# Patient Record
Sex: Female | Born: 1995 | Race: White | Hispanic: No | Marital: Single | Smoking: Former smoker
Health system: Southern US, Community
[De-identification: ages and names within clinical notes are randomized; demographics above are authoritative.]

## PROBLEM LIST (undated history)

## (undated) DIAGNOSIS — K8020 Calculus of gallbladder without cholecystitis without obstruction: Secondary | ICD-10-CM

## (undated) DIAGNOSIS — Z3490 Encounter for supervision of normal pregnancy, unspecified, unspecified trimester: Secondary | ICD-10-CM

## (undated) DIAGNOSIS — O99210 Obesity complicating pregnancy, unspecified trimester: Secondary | ICD-10-CM

## (undated) HISTORY — PX: NO PAST SURGERIES: SHX2092

## (undated) HISTORY — DX: Obesity complicating pregnancy, unspecified trimester: O99.210

## (undated) HISTORY — DX: Calculus of gallbladder without cholecystitis without obstruction: K80.20

## (undated) HISTORY — DX: Encounter for supervision of normal pregnancy, unspecified, unspecified trimester: Z34.90

---
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
## 2014-03-23 ENCOUNTER — Observation Stay: Payer: Self-pay | Admitting: Obstetrics and Gynecology

## 2014-03-24 ENCOUNTER — Observation Stay: Payer: Self-pay | Admitting: Obstetrics and Gynecology

## 2014-03-29 ENCOUNTER — Ambulatory Visit: Payer: Self-pay

## 2014-03-29 LAB — BASIC METABOLIC PANEL
ANION GAP: 8 (ref 7–16)
BUN: 9 mg/dL
CHLORIDE: 105 mmol/L
Calcium, Total: 9 mg/dL
Co2: 23 mmol/L
Creatinine: 0.52 mg/dL
Glucose: 75 mg/dL
Potassium: 4.3 mmol/L
Sodium: 136 mmol/L

## 2014-03-29 LAB — URIC ACID: Uric Acid: 5.6 mg/dL

## 2014-03-29 LAB — WBC: WBC: 13.1 10*3/uL — ABNORMAL HIGH (ref 3.6–11.0)

## 2014-03-29 LAB — PLATELET COUNT: Platelet: 257 10*3/uL (ref 150–440)

## 2014-03-29 LAB — SGOT (AST)(ARMC): SGOT(AST): 17 U/L

## 2014-03-29 LAB — HEMATOCRIT: HCT: 33.8 % — ABNORMAL LOW (ref 35.0–47.0)

## 2014-03-29 LAB — RBC: RBC: 3.92 10*6/uL (ref 3.80–5.20)

## 2014-03-29 LAB — HEMOGLOBIN: HGB: 10.9 g/dL — ABNORMAL LOW (ref 12.0–16.0)

## 2014-04-02 ENCOUNTER — Other Ambulatory Visit: Payer: Self-pay | Admitting: Obstetrics and Gynecology

## 2014-04-02 LAB — PROTEIN / CREATININE RATIO, URINE
Creatinine, Urine Random: 97 mg/dL (ref 30–125)
Protein, Urine: 16 mg/dL (ref 0–9)
Protein/Creat. Ratio: 165 mg/gCREAT (ref 0–200)

## 2014-04-08 ENCOUNTER — Inpatient Hospital Stay
Admit: 2014-04-08 | Disposition: A | Discharge status: Home / Self Care | Payer: Self-pay | Attending: Obstetrics and Gynecology | Admitting: Obstetrics and Gynecology

## 2014-04-08 LAB — CBC WITH DIFFERENTIAL/PLATELET
BASOS ABS: 0 10*3/uL (ref 0.0–0.1)
Basophil %: 0.3 %
EOS ABS: 0 10*3/uL (ref 0.0–0.7)
Eosinophil %: 0.2 %
HCT: 32.3 % — AB (ref 35.0–47.0)
HGB: 11 g/dL — ABNORMAL LOW (ref 12.0–16.0)
LYMPHS PCT: 13.8 %
Lymphocyte #: 2.1 10*3/uL (ref 1.0–3.6)
MCH: 29.1 pg (ref 26.0–34.0)
MCHC: 33.9 g/dL (ref 32.0–36.0)
MCV: 86 fL (ref 80–100)
Monocyte #: 1 x10 3/mm — ABNORMAL HIGH (ref 0.2–0.9)
Monocyte %: 6.9 %
NEUTROS ABS: 11.8 10*3/uL — AB (ref 1.4–6.5)
NEUTROS PCT: 78.8 %
Platelet: 253 10*3/uL (ref 150–440)
RBC: 3.77 10*6/uL — ABNORMAL LOW (ref 3.80–5.20)
RDW: 13.5 % (ref 11.5–14.5)
WBC: 15 10*3/uL — ABNORMAL HIGH (ref 3.6–11.0)

## 2014-04-08 LAB — GC/CHLAMYDIA PROBE AMP

## 2014-04-09 LAB — PROTEIN / CREATININE RATIO, URINE
CREATININE, URINE RANDOM: 43 mg/dL (ref 30–125)
PROTEIN/CREAT. RATIO: 209 mg/g{creat} — AB (ref 0–200)
Protein, Urine: 9 mg/dL (ref 0–9)

## 2014-04-11 LAB — HEMATOCRIT: HCT: 27.8 % — ABNORMAL LOW (ref 35.0–47.0)

## 2014-04-28 NOTE — Consult Note (Signed)
Referral Information:  Reason for Referral 19 yo gravida 2 para 0010 at 6538w0d by stated LMP of 07/11/2013 is referred due to obesity, cholelithiasis and FOB with type 1 DM.   Referring Physician Acquanetta BellingAngela Lugiano CNM, [facility]   Prenatal Hx The patient was [facility] 10/07/2013-10/08/2013 for gallstones.  There were several noted on US with the largest measuring 2 cm.  LFTs normal.  Episode of hives at 13 weeks treated with prednisone dose pack.   Past Obstetrical Hx 01[place]06[place]  Had had 2 ultrasounds and heartbeat had been seen.  Was told she couldn't carry a pregnancy.   Home Medications: Medication Instructions Status  Prenatal Multivitamins Prenatal Multivitamins oral tablet 1 tab(s) orally once a day Active   Allergies:   No Known Allergies:   Vital Signs/Notes:  Nursing Vital Signs: **Vital Signs.:   19-Oct-15 10:45  Vital Signs Type Routine  Temperature Temperature (F) 98.3  Celsius 36.8  Temperature Source oral  Pulse Pulse 96  Respirations Respirations 18  Systolic BP Systolic BP 115  Diastolic BP (mmHg) Diastolic BP (mmHg) 63  Mean BP 80  Pulse Ox % Pulse Ox % 99  Pulse Ox Activity Level  At rest  Oxygen Delivery Room Air/ 21 %   Perinatal Consult:  LMP 11-Jul-2013   PGyn Hx Thinks she conceived on DMPA   Past Medical History cont'd Cholelithiasis   PSurg Hx None   FHx FOB has type 1 DM diagnosed at age 89.  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 obesity (BMI = 36) 2. cholelithiasis - currently asymptomatic  3. FOB with type 1 DM 4. History of early second trimester loss?   Recommendations 1. obesity (BMI = 36) -- HbA1C today -- early and routine glucose screening -- ultrasounds for growth at 28 and 36 weeks -- limit PN weight gain to 15 lbs -- ASA 81 mg po daily starting now 2. cholelithiasis without cholecystitis -- the patient has been counseled by Ms. Hilda BladesLugiano about a low fat diet -- the patient would be managed expectantly during pregnancy, although should she need surgery, the ideal time would be prior to [redacted] weeks gestation.   3. FOB with type 1 DM -- the patient was counseled that type 1 DM is now inheritable 4. History of early second trimester loss? -- documentation is not available and paitent was 19 years old   Plan:  Ultrasound at what gestational ages 6328 and 6334 weeks   Additional Testing HbA1C, Early and routine glucose screening   Comment/Plan Thank you for allowing us to participate in her care.    Total Time Spent with Patient 45 minutes   Office Use  Only 99243  Level 3 ( ) NEW office consult detailed   Coding Description: MATERNAL CONDITIONS/HISTORY INDICATION(S).   Obesity - BMI greater than equal to 30.   OTHER: cholelithiasis.  Electronic Signatures: Lady Deutscher (MD)  (Signed 19-Oct-15 14:10)  Authored: Referral, Home Medications, Allergies, Vital Signs/Notes, Consult, Exam, Impression, Plan, Billing, Coding Description   Last Updated: 19-Oct-15 14:10 by Lady Deutscher (MD)

## 2014-04-30 LAB — SURGICAL PATHOLOGY

## 2014-05-06 NOTE — Op Note (Signed)
PATIENT NAME:  Alicia Weaver, [name]#:  [id number] DATE OF BIRTH:  April 14, 1995  DATE OF PROCEDURE:  04/10/2014  PREOPERATIVE DIAGNOSES:  1. A 19 year old gravida 2, para 0-0-1-0 at 7839 and 5 weeks.  2. Gestational hypertension, induction of labor.  3. Group B streptococcus negative.  4. Maternal exhaustion  POSTOPERATIVE DIAGNOSES: 1. A 19 year old gravida 2, para 0-0-1-0 at 5739 and 5 weeks.  2. Gestational hypertension, induction of labor.  3. Group B streptococcus negative.  4. Delivery of a viable female infant.   5. Thick meconium.  6. Nuchal cord x3.  7. Prolonged second stage. 8. Marginal cord insertion  PROCEDURE: Vacuum assisted vaginal delivery with repair of right sulcal laceration.   SURGEON: Christeen DouglasBethany Kadra Kohan, MD   ASSISTANT: Milon Scorearon Jones, CNM   ESTIMATED BLOOD LOSS: 400 mL.   ANESTHESIA: Epidural and local anesthesia.   COMPLICATIONS: None.   FINDINGS: Viable female infant weighing 6 pounds, 10 ounces with Apgars of 2 and 8 at 1 and 5 minutes respectively, named Alicia Weaver. Thick terminal meconium. Nuchal cord tightly wrapped x3. Group B streptococcus negative, Rh+.   INDICATION FOR PROCEDURE: Ms. [name] is a 19 year old gravida 2, para 0-0-1-0, who presented for induction of labor for gestational hypertension. Her blood pressures did range from normal to the mild range and her preeclampsia laboratories were negative.   PROCEDURE: The patient underwent cervical ripening with Cervidil and Cytotec. She was augmented with a little bit of Pitocin during her laboring course. She did reach the second stage of labor, at which point she began to push over an intact perineum. Maternal pushing efforts were weak and the patient did complain of exhaustion. She pushed actively and impressively for 4.5 hours, at which point the decision was made to proceed with a vacuum-assisted vaginal delivery for maternal exhaustion. The risks and benefits of this procedure were discussed with the  patient and she agreed to the procedure.   After adequate anesthesia was assured, the vacuum was placed. The Kiwi vacuum cup was placed at the fetal vertex and vaginal tissue was assured to be away from the cup using digital evaluation.  At the next contraction with good maternal effort, the fetal head was delivered with 1 pull and no pop off. There were 3 tight nuchal cords: The cords were doubly clamped and cut and a second double clamping and cutting was required to release the fetal head. The anterior shoulder delivered promptly and the baby's body delivered immediately. He was not vigorous and he was passed immediately to awaiting Neonatal Intensive Care Unit staff. Terminal meconium was noted at this time, as well. The placenta delivered with gentle traction on the cord and was intact. A marginal cord insertion was noted and meconium-stained membranes were noted.   The fundus was massaged and found to be firm. Postpartum Pitocin was given. Evaluation of the vagina and perineum revealed a deep right sulcal laceration. Rectal examination revealed intact rectal mucosa. This was repaired with 2-0 and 3-0 Vicryl Rapide. The vagina was packed with a single sponge for 1 hour post the procedure to control oozing from the suture sites. The vagina was edematous, but no other lacerations were noted. Bleeding was moderate. The patient tolerated these procedures well and is stable in recovery.    ____________________________ Cline CoolsBethany E. Karrington Studnicka, MD beb:AT D: 04/10/2014 12:52:51 ET T: 04/10/2014 13:26:15 ET JOB#: [id number]  cc: Cline CoolsBethany E. Asberry Lascola, MD, <Dictator> Cline CoolsBETHANY E Breeze Berringer MD ELECTRONICALLY SIGNED 04/10/2014 17:45

## 2014-05-15 NOTE — H&P (Signed)
L&D Evaluation:  History:  HPI 18yo G2P0010 at 39+3days presenting for iol/gHTN. Occasional contracations, good FM, no LOF or VB. Denies Headache, RUQ pain, visual changes. +edema. BP max on admission 158/93.  GBS neg, Rh postive, rubella immune. PreE labs pending. Urine dip pending. WBC elevated to 15.0.   Preg c/b: cholelithiasis, obesity, gHTN on 81mg  ASA per MFM recommendations which she stopped at 36wks.  Postpartum: Pt plans breastfeeding, Nexplanon   Patient's Surgical History none   Medications Pre Natal Vitamins   Allergies NKDA   Social History none   Family History FOB Type 1 diabetic   ROS:  ROS All systems were reviewed.  HEENT, CNS, GI, GU, Respiratory, CV, Renal and Musculoskeletal systems were found to be normal., no RUQ pain   Exam:  Vital Signs BP >140/90   Urine Protein not completed   General no apparent distress   Mental Status clear   Chest clear   Heart normal sinus rhythm   Abdomen gravid, non-tender   Estimated Fetal Weight Average for gestational age   Fetal Position Cephalic   Back no CVAT   Edema 1+   Reflexes 1+   Clonus negative   Mebranes Intact   FHT normal rate with no decels, +accels   Fetal Heart Rate 145   Ucx irregular   Skin dry   Lymph no lymphadenopathy   Impression:  Impression PIH   Plan:  Plan UA, PIH panel   Comments IOL for gHTN. 20mg  iv labetolol within 15 min if severe range BP. PIH panel and urine dip pending. No abx for GBS neg. Baby boy "Gunner".   Electronic Signatures: Cline CoolsBeasley, Jerol Rufener E (MD)  (Signed 03-Apr-16 22:13)  Authored: L&D Evaluation   Last Updated: 03-Apr-16 22:13 by Cline CoolsBeasley, Kennady Zimmerle E (MD)

## 2014-05-15 NOTE — H&P (Signed)
L&D Evaluation:  History:  HPI 4319 G1 at 37+1 by LMP c/w 20 with new onset HTN yesterday in the office. Yesterday 140/80 then 144/88. Evaluation today concern for BP 160/90. Sent directly to L&D for BP monitoring. No HA, vision changes, RUQ pain. No ctx, LOF, VB.   Patient's Medical History No Chronic Illness   Patient's Surgical History none   Medications Pre Natal Vitamins   Allergies NKDA   Social History none   Family History Non-Contributory   ROS:  ROS All systems were reviewed.  HEENT, CNS, GI, GU, Respiratory, CV, Renal and Musculoskeletal systems were found to be normal.   Exam:  Vital Signs stable  BP 105-126/63-78   Urine Protein 1+   General no apparent distress   Mental Status clear   Chest clear   Heart normal sinus rhythm   Abdomen gravid, non-tender   Estimated Fetal Weight Average for gestational age   Edema 1+   Reflexes 1+   Clonus negative   Plan:  Comments 1. R/O GHTN: Ms. Dareen Pianonderson displays no evidence of HTN currently and is asymptomatic with normal labs. I have concerns that the check today was erroneous. Her arm circumference is right at the cut off for being appropriate for that cuff in our office and I question the legitimacy of it on her. Will monitor BP tomorrow AM for certainty. Strict precautions provided. Discussed the r/b/a of IOL 2. UTI noted on UA yesterday will treat with Macrobid.  3. D/C home stable, follow-up in AM.   Electronic Signatures: Areta HaberGoodman, David M (MD)  (Signed 18-Mar-16 14:02)  Authored: L&D Evaluation   Last Updated: 18-Mar-16 14:02 by Areta HaberGoodman, David M (MD)

## 2014-05-15 NOTE — H&P (Signed)
L&D Evaluation:  History:  HPI 3219 G1 at 37+2 by LMP c/w 20 with new onset HTN yesterday in the office. Thursday 140/80 then 144/88. Evaluation Friday concern for BP 160/90, but BP in triage for 6 hours was low BP 105-126/63-78s. Returns today for follow-up monitoring. No HA, vision changes, RUQ pain. No ctx, LOF, VB.   Patient's Medical History No Chronic Illness   Patient's Surgical History none   Medications Pre Natal Vitamins   Allergies NKDA   Social History none   Family History Non-Contributory   ROS:  ROS All systems were reviewed.  HEENT, CNS, GI, GU, Respiratory, CV, Renal and Musculoskeletal systems were found to be normal.   Exam:  Vital Signs stable  120-137/56-83   Urine Protein 1+   General no apparent distress   Mental Status clear   Chest clear   Heart normal sinus rhythm   Abdomen gravid, non-tender   Estimated Fetal Weight Average for gestational age   Edema 1+   Reflexes 1+   Clonus negative   FHT normal rate with no decels, Reactive   Plan:  Comments 1. R/O GHTN: Ms. Dareen Pianonderson displays no evidence of HTN currently and is asymptomatic with normal labs. I have concerns that the check in office was erroneous. Her arm circumference is right at the cut off for being appropriate for that cuff in our office and I question the legitimacy of it on her.  Strict precautions provided. Discussed the r/b/a of IOL Plan for follow-up Wednesday. 2. UTI noted on UA yesterday will treat with Macrobid.  3. D/C home stable, Wednesday in office   Follow Up Appointment need to schedule   Electronic Signatures: Areta HaberGoodman, David M (MD)  (Signed 19-Mar-16 11:07)  Authored: L&D Evaluation   Last Updated: 19-Mar-16 11:07 by Areta HaberGoodman, David M (MD)

## 2014-11-06 ENCOUNTER — Emergency Department
Admission: EM | Admit: 2014-11-06 | Discharge: 2014-11-06 | Disposition: A | Discharge status: Home / Self Care | Payer: Self-pay | Attending: Emergency Medicine | Admitting: Emergency Medicine

## 2014-11-06 ENCOUNTER — Emergency Department: Payer: Self-pay

## 2014-11-06 ENCOUNTER — Encounter: Payer: Self-pay | Admitting: Emergency Medicine

## 2014-11-06 DIAGNOSIS — S93401A Sprain of unspecified ligament of right ankle, initial encounter: Secondary | ICD-10-CM | POA: Insufficient documentation

## 2014-11-06 DIAGNOSIS — Y9389 Activity, other specified: Secondary | ICD-10-CM | POA: Insufficient documentation

## 2014-11-06 DIAGNOSIS — Y998 Other external cause status: Secondary | ICD-10-CM | POA: Insufficient documentation

## 2014-11-06 DIAGNOSIS — Y92410 Unspecified street and highway as the place of occurrence of the external cause: Secondary | ICD-10-CM | POA: Insufficient documentation

## 2014-11-06 DIAGNOSIS — Z87891 Personal history of nicotine dependence: Secondary | ICD-10-CM | POA: Insufficient documentation

## 2014-11-06 MED ORDER — IBUPROFEN 800 MG PO TABS: 800.0000 mg | ORAL_TABLET | Freq: Three times a day (TID) | ORAL | Status: DC | PRN

## 2014-11-06 MED ORDER — IBUPROFEN 800 MG PO TABS
800.0000 mg | ORAL_TABLET | Freq: Once | ORAL | Status: AC
  Administered 2014-11-06: 800 mg via ORAL

## 2014-11-06 MED ORDER — IBUPROFEN 800 MG PO TABS
ORAL_TABLET | ORAL | Status: AC
  Administered 2014-11-06: 800 mg via ORAL
  Filled 2014-11-06: qty 1

## 2014-11-06 NOTE — Discharge Instructions (Signed)
Ankle Sprain  An ankle sprain is an injury to the strong, fibrous tissues (ligaments) that hold the bones of your ankle joint together.   CAUSES  An ankle sprain is usually caused by a fall or by twisting your ankle. Ankle sprains most commonly occur when you step on the outer edge of your foot, and your ankle turns inward. People who participate in sports are more prone to these types of injuries.   SYMPTOMS    Pain in your ankle. The pain may be present at rest or only when you are trying to stand or walk.   Swelling.   Bruising. Bruising may develop immediately or within 1 to 2 days after your injury.   Difficulty standing or walking, particularly when turning corners or changing directions.  DIAGNOSIS   Your caregiver will ask you details about your injury and perform a physical exam of your ankle to determine if you have an ankle sprain. During the physical exam, your caregiver will press on and apply pressure to specific areas of your foot and ankle. Your caregiver will try to move your ankle in certain ways. An X-ray exam may be done to be sure a bone was not broken or a ligament did not separate from one of the bones in your ankle (avulsion fracture).   TREATMENT   Certain types of braces can help stabilize your ankle. Your caregiver can make a recommendation for this. Your caregiver may recommend the use of medicine for pain. If your sprain is severe, your caregiver may refer you to a surgeon who helps to restore function to parts of your skeletal system (orthopedist) or a physical therapist.  HOME CARE INSTRUCTIONS    Apply ice to your injury for 1-2 days or as directed by your caregiver. Applying ice helps to reduce inflammation and pain.    Put ice in a plastic bag.    Place a towel between your skin and the bag.    Leave the ice on for 15-20 minutes at a time, every 2 hours while you are awake.   Only take over-the-counter or prescription medicines for pain, discomfort, or fever as directed by  your caregiver.   Elevate your injured ankle above the level of your heart as much as possible for 2-3 days.   If your caregiver recommends crutches, use them as instructed. Gradually put weight on the affected ankle. Continue to use crutches or a cane until you can walk without feeling pain in your ankle.   If you have a plaster splint, wear the splint as directed by your caregiver. Do not rest it on anything harder than a pillow for the first 24 hours. Do not put weight on it. Do not get it wet. You may take it off to take a shower or bath.   You may have been given an elastic bandage to wear around your ankle to provide support. If the elastic bandage is too tight (you have numbness or tingling in your foot or your foot becomes cold and blue), adjust the bandage to make it comfortable.   If you have an air splint, you may blow more air into it or let air out to make it more comfortable. You may take your splint off at night and before taking a shower or bath. Wiggle your toes in the splint several times per day to decrease swelling.  SEEK MEDICAL CARE IF:    You have rapidly increasing bruising or swelling.   Your toes feel   extremely cold or you lose feeling in your foot.   Your pain is not relieved with medicine.  SEEK IMMEDIATE MEDICAL CARE IF:   Your toes are numb or blue.   You have severe pain that is increasing.  MAKE SURE YOU:    Understand these instructions.   Will watch your condition.   Will get help right away if you are not doing well or get worse.     This information is not intended to replace advice given to you by your health care provider. Make sure you discuss any questions you have with your health care provider.     Document Released: 12/22/2004 Document Revised: 01/12/2014 Document Reviewed: 01/03/2011  Elsevier Interactive Patient Education 2016 [employer]
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
## 2015-02-23 ENCOUNTER — Ambulatory Visit
Admission: EM | Admit: 2015-02-23 | Discharge: 2015-02-23 | Disposition: A | Discharge status: Home / Self Care | Payer: BLUE CROSS/BLUE SHIELD | Attending: Family Medicine | Admitting: Family Medicine

## 2015-02-23 ENCOUNTER — Encounter: Payer: Self-pay | Admitting: Gynecology

## 2015-02-23 DIAGNOSIS — J029 Acute pharyngitis, unspecified: Secondary | ICD-10-CM

## 2015-02-23 LAB — CBC WITH DIFFERENTIAL/PLATELET
BASOS ABS: 0.1 10*3/uL (ref 0–0.1)
Basophils Relative: 1 %
Eosinophils Absolute: 0 10*3/uL (ref 0–0.7)
Eosinophils Relative: 0 %
HCT: 36.8 % (ref 35.0–47.0)
Hemoglobin: 12.3 g/dL (ref 12.0–16.0)
LYMPHS PCT: 12 %
Lymphs Abs: 1.8 10*3/uL (ref 1.0–3.6)
MCH: 28.1 pg (ref 26.0–34.0)
MCHC: 33.4 g/dL (ref 32.0–36.0)
MCV: 84.1 fL (ref 80.0–100.0)
MONO ABS: 1.1 10*3/uL — AB (ref 0.2–0.9)
MONOS PCT: 8 %
NEUTROS ABS: 11.2 10*3/uL — AB (ref 1.4–6.5)
Neutrophils Relative %: 79 %
Platelets: 299 10*3/uL (ref 150–440)
RBC: 4.38 MIL/uL (ref 3.80–5.20)
RDW: 13.4 % (ref 11.5–14.5)
WBC: 14.2 10*3/uL — ABNORMAL HIGH (ref 3.6–11.0)

## 2015-02-23 LAB — RAPID STREP SCREEN (MED CTR MEBANE ONLY): Streptococcus, Group A Screen (Direct): NEGATIVE

## 2015-02-23 LAB — MONONUCLEOSIS SCREEN: Mono Screen: NEGATIVE

## 2015-02-23 LAB — RAPID INFLUENZA A&B ANTIGENS
Influenza A (ARMC): NOT DETECTED
Influenza B (ARMC): NOT DETECTED

## 2015-02-23 MED ORDER — AZITHROMYCIN 250 MG PO TABS: ORAL_TABLET | ORAL | Status: DC

## 2015-02-23 MED ORDER — AZITHROMYCIN 500 MG PO TABS
500.0000 mg | ORAL_TABLET | Freq: Once | ORAL | Status: AC
  Administered 2015-02-23: 500 mg via ORAL

## 2015-02-23 MED ORDER — FEXOFENADINE-PSEUDOEPHED ER 180-240 MG PO TB24: 1.0000 | ORAL_TABLET | Freq: Every day | ORAL | Status: DC

## 2015-02-23 NOTE — ED Provider Notes (Signed)
CSN: [id number]     Arrival date & time 02/23/15  1547 History   First MD Initiated Contact with Patient 02/23/15 1840    Nurses notes were reviewed.  Chief Complaint  Patient presents with  . Sore Throat  . Otalgia   Patient states she's had a sore throat since Wednesday. He reports sore throat has progressively gotten worse since Wednesday. Hurts to swallow. She denies having fatigue or tiredness. In further questioning she does still smoke but not as much as she used to. She is a 20 year old child at homethe family history pertinent to illness.  (Consider location/radiation/quality/duration/timing/severity/associated sxs/prior Treatment) HPI  History reviewed. No pertinent past medical history. History reviewed. No pertinent past surgical history. No family history on file. Social History  Substance Use Topics  . Smoking status: Former Games developer  . Smokeless tobacco: None  . Alcohol Use: No   OB History    No data available     Review of Systems  Allergies  Review of patient's allergies indicates no known allergies.  Home Medications   Prior to Admission medications   Medication Sig Start Date End Date Taking? Authorizing Provider  ibuprofen (ADVIL,MOTRIN) 800 MG tablet Take 1 tablet (800 mg total) by mouth every 8 (eight) hours as needed. 11/06/14  Yes Darci Current, MD  azithromycin (ZITHROMAX Z-PAK) 250 MG tablet Take 2 tablets first day and then 1 po a day for 4 days 02/23/15   Hassan Rowan, MD  fexofenadine-pseudoephedrine (ALLEGRA-D ALLERGY & CONGESTION) 180-240 MG 24 hr tablet Take 1 tablet by mouth daily. 02/23/15   Hassan Rowan, MD   Meds Ordered and Administered this Visit   Medications  azithromycin [facility]) tablet 500 mg (500 mg Oral Given 02/23/15 1917)    BP 126/78 mmHg  Pulse 85  Temp(Src) 98.5 F (36.9 C) (Oral)  Resp 16  Ht  (1.6 m)  Wt 215 lb (97.523 kg)  BMI 38.09 kg/m2  SpO2 99% No data found.   Physical Exam  Constitutional: She is  oriented to person, place, and time. She appears well-developed and well-nourished.  Non-toxic appearance. She does not have a sickly appearance. She appears ill.  Obese white female  HENT:  Head: Normocephalic and atraumatic.  Right Ear: Hearing, tympanic membrane, external ear and ear canal normal.  Left Ear: Hearing, tympanic membrane, external ear and ear canal normal.  Nose: Mucosal edema present.  Mouth/Throat: No dental abscesses or dental caries. Posterior oropharyngeal edema and posterior oropharyngeal erythema present.  Eyes: Conjunctivae are normal. Pupils are equal, round, and reactive to light.  Neck: Normal range of motion. Neck supple.  Cardiovascular: Normal rate, regular rhythm and normal heart sounds.   Pulmonary/Chest: Effort normal and breath sounds normal.  Musculoskeletal: Normal range of motion.  Lymphadenopathy:    She has cervical adenopathy.  Neurological: She is alert and oriented to person, place, and time.  Skin: Skin is warm and dry.  Psychiatric: She has a normal mood and affect. Her behavior is normal.  Vitals reviewed.   ED Course  Procedures (including critical care time)  Labs Review Labs Reviewed  CBC WITH DIFFERENTIAL/PLATELET - Abnormal; Notable for the following:    WBC 14.2 (*)    Neutro Abs 11.2 (*)    Monocytes Absolute 1.1 (*)    All other components within normal limits  RAPID STREP SCREEN (NOT AT [facility])  RAPID INFLUENZA A&B ANTIGENS ([facility] ONLY)  CULTURE, GROUP A STREP [facility])  MONONUCLEOSIS SCREEN    Imaging Review  No results found.   Visual Acuity Review  Right Eye Distance:   Left Eye Distance:   Bilateral Distance:    Right Eye Near:   Left Eye Near:    Bilateral Near:     Results for orders placed or performed during the hospital encounter of 02/23/15  Rapid strep screen  Result Value Ref Range   Streptococcus, Group A Screen (Direct) NEGATIVE NEGATIVE  Rapid Influenza A&B Antigens ([facility] only)  Result Value Ref  Range   Influenza A ([facility]) NOT DETECTED    Influenza B ([facility]) NOT DETECTED   CBC with Differential  Result Value Ref Range   WBC 14.2 (H) 3.6 - 11.0 K/uL   RBC 4.38 3.80 - 5.20 MIL/uL   Hemoglobin 12.3 12.0 - 16.0 g/dL   HCT 16.1 09.6 - 04.5 %   MCV 84.1 80.0 - 100.0 fL   MCH 28.1 26.0 - 34.0 pg   MCHC 33.4 32.0 - 36.0 g/dL   RDW 40.9 81.1 - 91.4 %   Platelets 299 150 - 440 K/uL   Neutrophils Relative % 79 %   Neutro Abs 11.2 (H) 1.4 - 6.5 K/uL   Lymphocytes Relative 12 %   Lymphs Abs 1.8 1.0 - 3.6 K/uL   Monocytes Relative 8 %   Monocytes Absolute 1.1 (H) 0.2 - 0.9 K/uL   Eosinophils Relative 0 %   Eosinophils Absolute 0.0 0 - 0.7 K/uL   Basophils Relative 1 %   Basophils Absolute 0.1 0 - 0.1 K/uL  Mononucleosis screen  Result Value Ref Range   Mono Screen NEGATIVE NEGATIVE     MDM   1. Acute pharyngitis, unspecified etiology    patient strep and mono was negative white count was only 14,000. However because of the x-rays on both tonsils especially right we'll place her on Zithromax first dose given here 500 mg and then start on a Z-Pak and Allegra-D. Work note was given for today and tomorrow. Start patient call PCP if not better in a week.  Note: This dictation was prepared with Dragon dictation along with smaller phrase technology. Any transcriptional errors that result from this process are unintentional.      Hassan Rowan, MD 02/23/15 [phone]

## 2015-02-23 NOTE — ED Notes (Signed)
Patient c/o sore throat / painful to swallow and bloody mucous when coughing . Patient also stated right ear pain.

## 2015-02-23 NOTE — Discharge Instructions (Signed)
Pharyngitis Pharyngitis is a sore throat (pharynx). There is redness, pain, and swelling of your throat. HOME CARE   Drink enough fluids to keep your pee (urine) clear or pale yellow.  Only take medicine as told by your doctor.  You may get sick again if you do not take medicine as told. Finish your medicines, even if you start to feel better.  Do not take aspirin.  Rest.  Rinse your mouth (gargle) with salt water ( tsp of salt per 1 qt of water) every 1-2 hours. This will help the pain.  If you are not at risk for choking, you can suck on hard candy or sore throat lozenges. GET HELP IF:  You have large, tender lumps on your neck.  You have a rash.  You cough up green, yellow-brown, or bloody spit. GET HELP RIGHT AWAY IF:   You have a stiff neck.  You drool or cannot swallow liquids.  You throw up (vomit) or are not able to keep medicine or liquids down.  You have very bad pain that does not go away with medicine.  You have problems breathing (not from a stuffy nose). MAKE SURE YOU:   Understand these instructions.  Will watch your condition.  Will get help right away if you are not doing well or get worse.   This information is not intended to replace advice given to you by your health care provider. Make sure you discuss any questions you have with your health care provider.   Document Released: 06/10/2007 Document Revised: 10/12/2012 Document Reviewed: 08/29/2012 Elsevier Interactive Patient Education 2016 Else[employer] Inc[employer]  Upper Respiratory Infection, Adult Most upper respiratory infections (URIs) are a viral infection of the air passages leading to the lungs. A URI affects the nose, throat, and upper air passages. The most common type of URI is nasopharyngitis and is typically referred to as "the common cold." URIs run their course and usually go away on their own. Most of the time, a URI does not require medical attention, but sometimes a bacterial infection in  the upper airways can follow a viral infection. This is called a secondary infection. Sinus and middle ear infections are common types of secondary upper respiratory infections. Bacterial pneumonia can also complicate a URI. A URI can worsen asthma and chronic obstructive pulmonary disease (COPD). Sometimes, these complications can require emergency medical care and may be life threatening.  CAUSES Almost all URIs are caused by viruses. A virus is a type of germ and can spread from one person to another.  RISKS FACTORS You may be at risk for a URI if:   You smoke.   You have chronic heart or lung disease.  You have a weakened defense (immune) system.   You are very young or very old.   You have nasal allergies or asthma.  You work in crowded or poorly ventilated areas.  You work in health care facilities or schools. SIGNS AND SYMPTOMS  Symptoms typically develop 2-3 days after you come in contact with a cold virus. Most viral URIs last 7-10 days. However, viral URIs from the influenza virus (flu virus) can last 14-18 days and are typically more severe. Symptoms may include:   Runny or stuffy (congested) nose.   Sneezing.   Cough.   Sore throat.   Headache.   Fatigue.   Fever.   Loss of appetite.   Pain in your forehead, behind your eyes, and over your cheekbones (sinus pain).  Muscle aches.  DIAGNOSIS  Your health care provider may diagnose a URI by:  Physical exam.  Tests to check that your symptoms are not due to another condition such as:  Strep throat.  Sinusitis.  Pneumonia.  Asthma. TREATMENT  A URI goes away on its own with time. It cannot be cured with medicines, but medicines may be prescribed or recommended to relieve symptoms. Medicines may help:  Reduce your fever.  Reduce your cough.  Relieve nasal congestion. HOME CARE INSTRUCTIONS   Take medicines only as directed by your health care provider.   Gargle warm saltwater or  take cough drops to comfort your throat as directed by your health care provider.  Use a warm mist humidifier or inhale steam from a shower to increase air moisture. This may make it easier to breathe.  Drink enough fluid to keep your urine clear or pale yellow.   Eat soups and other clear broths and maintain good nutrition.   Rest as needed.   Return to work when your temperature has returned to normal or as your health care provider advises. You may need to stay home longer to avoid infecting others. You can also use a face mask and careful hand washing to prevent spread of the virus.  Increase the usage of your inhaler if you have asthma.   Do not use any tobacco products, including cigarettes, chewing tobacco, or electronic cigarettes. If you need help quitting, ask your health care provider. PREVENTION  The best way to protect yourself from getting a cold is to practice good hygiene.   Avoid oral or hand contact with people with cold symptoms.   Wash your hands often if contact occurs.  There is no clear evidence that vitamin C, vitamin E, echinacea, or exercise reduces the chance of developing a cold. However, it is always recommended to get plenty of rest, exercise, and practice good nutrition.  SEEK MEDICAL CARE IF:   You are getting worse rather than better.   Your symptoms are not controlled by medicine.   You have chills.  You have worsening shortness of breath.  You have brown or red mucus.  You have yellow or brown nasal discharge.  You have pain in your face, especially when you bend forward.  You have a fever.  You have swollen neck glands.  You have pain while swallowing.  You have white areas in the back of your throat. SEEK IMMEDIATE MEDICAL CARE IF:   You have severe or persistent:  Headache.  Ear pain.  Sinus pain.  Chest pain.  You have chronic lung disease and any of the following:  Wheezing.  Prolonged cough.  Coughing up  blood.  A change in your usual mucus.  You have a stiff neck.  You have changes in your:  Vision.  Hearing.  Thinking.  Mood. MAKE SURE YOU:   Understand these instructions.  Will watch your condition.  Will get help right away if you are not doing well or get worse.   This information is not intended to replace advice given to you by your health care provider. Make sure you discuss any questions you have with your health care provider.   Document Released: 06/17/2000 Document Revised: 05/08/2014 Document Reviewed: 03/29/2013 Elsevier Interactive Patient Education [employer]

## 2015-02-25 LAB — CULTURE, GROUP A STREP (THRC)

## 2015-02-27 ENCOUNTER — Telehealth: Payer: Self-pay | Admitting: Emergency Medicine

## 2015-02-27 NOTE — ED Notes (Signed)
Patient notified that her throat culture came back positive for Strep.  Patient states that she has one more day of her Zithromax left and that she is feeling better.  Patient was instructed to finish all of her antibiotic and that if her symptoms persist or worsen to follow-up here or with her PCP.  Patient verbalized understanding.

## 2016-05-31 DIAGNOSIS — Z87891 Personal history of nicotine dependence: Secondary | ICD-10-CM | POA: Diagnosis not present

## 2016-05-31 DIAGNOSIS — R1011 Right upper quadrant pain: Secondary | ICD-10-CM | POA: Diagnosis present

## 2016-05-31 DIAGNOSIS — K8020 Calculus of gallbladder without cholecystitis without obstruction: Secondary | ICD-10-CM | POA: Diagnosis not present

## 2016-05-31 LAB — CBC
HCT: 41.6 % (ref 35.0–47.0)
HEMOGLOBIN: 14.1 g/dL (ref 12.0–16.0)
MCH: 29.7 pg (ref 26.0–34.0)
MCHC: 33.8 g/dL (ref 32.0–36.0)
MCV: 87.8 fL (ref 80.0–100.0)
Platelets: 276 10*3/uL (ref 150–440)
RBC: 4.74 MIL/uL (ref 3.80–5.20)
RDW: 13.2 % (ref 11.5–14.5)
WBC: 13.5 10*3/uL — ABNORMAL HIGH (ref 3.6–11.0)

## 2016-05-31 NOTE — ED Triage Notes (Signed)
Patient reports mid abdominal pain for approximately one hour.  Reports  Pain worse with movement.

## 2016-06-01 ENCOUNTER — Emergency Department
Admission: EM | Admit: 2016-06-01 | Discharge: 2016-06-01 | Disposition: A | Discharge status: Home / Self Care | Payer: BLUE CROSS/BLUE SHIELD | Attending: Emergency Medicine | Admitting: Emergency Medicine

## 2016-06-01 ENCOUNTER — Emergency Department: Payer: BLUE CROSS/BLUE SHIELD

## 2016-06-01 DIAGNOSIS — R1011 Right upper quadrant pain: Secondary | ICD-10-CM

## 2016-06-01 DIAGNOSIS — K8020 Calculus of gallbladder without cholecystitis without obstruction: Secondary | ICD-10-CM

## 2016-06-01 LAB — COMPREHENSIVE METABOLIC PANEL
ALK PHOS: 66 U/L (ref 38–126)
ALT: 26 U/L (ref 14–54)
ANION GAP: 10 (ref 5–15)
AST: 20 U/L (ref 15–41)
Albumin: 4.4 g/dL (ref 3.5–5.0)
BUN: 19 mg/dL (ref 6–20)
CALCIUM: 9.4 mg/dL (ref 8.9–10.3)
CHLORIDE: 103 mmol/L (ref 101–111)
CO2: 26 mmol/L (ref 22–32)
Creatinine, Ser: 1.07 mg/dL — ABNORMAL HIGH (ref 0.44–1.00)
GFR calc non Af Amer: >60 mL/min (ref >60)
Glucose, Bld: 102 mg/dL — ABNORMAL HIGH (ref 65–99)
Potassium: 4.5 mmol/L (ref 3.5–5.1)
SODIUM: 139 mmol/L (ref 135–145)
Total Bilirubin: 0.5 mg/dL (ref 0.3–1.2)
Total Protein: 7.5 g/dL (ref 6.5–8.1)

## 2016-06-01 LAB — URINALYSIS, COMPLETE (UACMP) WITH MICROSCOPIC
BILIRUBIN URINE: NEGATIVE
Glucose, UA: NEGATIVE mg/dL
HGB URINE DIPSTICK: NEGATIVE
Ketones, ur: NEGATIVE mg/dL
NITRITE: NEGATIVE
PROTEIN: NEGATIVE mg/dL
Specific Gravity, Urine: 1.02 (ref 1.005–1.030)
pH: 6 (ref 5.0–8.0)

## 2016-06-01 LAB — POCT PREGNANCY, URINE: Preg Test, Ur: NEGATIVE

## 2016-06-01 LAB — LIPASE, BLOOD: Lipase: 36 U/L (ref 11–51)

## 2016-06-01 MED ORDER — KETOROLAC TROMETHAMINE 30 MG/ML IJ SOLN
15.0000 mg | Freq: Once | INTRAMUSCULAR | Status: AC
  Administered 2016-06-01: 15 mg via INTRAVENOUS
  Filled 2016-06-01: qty 1

## 2016-06-01 MED ORDER — SODIUM CHLORIDE 0.9 % IV BOLUS (SEPSIS)
1000.0000 mL | Freq: Once | INTRAVENOUS | Status: AC
  Administered 2016-06-01: 1000 mL via INTRAVENOUS

## 2016-06-01 NOTE — ED Provider Notes (Signed)
[facility] Emergency Department Provider Note  ____________________________________________  Time seen: Approximately 2:12 AM  I have reviewed the triage vital signs and the nursing notes.   HISTORY  Chief Complaint Abdominal Pain   HPI Alicia Weaver is a 21 y.o. female with a history of cholelithiasis who presents for evaluation of epigastric abdominal pain. Patient reports that the pain started 1 hour prior to arrival, currently 9 out of 10, dull located in the epigastric region, constant and nonradiating. No nausea, no vomiting, no diarrhea, no fever or chills, no shortness of breath or chest pain, no dysuria or hematuria. patient reports pain is similar to her prior episode of cholelithiasis. LMP 2 years ago since patient has nexplanon. No prior abdominal surgeries. No NSAIDs, no alcohol.  No past medical history on file.  There are no active problems to display for this patient.   No past surgical history on file.  Prior to Admission medications   Medication Sig Start Date End Date Taking? Authorizing Provider  azithromycin (ZITHROMAX Z-PAK) 250 MG tablet Take 2 tablets first day and then 1 po a day for 4 days 02/23/15   Hassan Rowan, MD  fexofenadine-pseudoephedrine (ALLEGRA-D ALLERGY & CONGESTION) 180-240 MG 24 hr tablet Take 1 tablet by mouth daily. 02/23/15   Hassan Rowan, MD  ibuprofen (ADVIL,MOTRIN) 800 MG tablet Take 1 tablet (800 mg total) by mouth every 8 (eight) hours as needed. 11/06/14   Darci Current, MD    Allergies Patient has no known allergies.  No family history on file.  Social History Social History  Substance Use Topics  . Smoking status: Former Games developer  . Smokeless tobacco: Not on file  . Alcohol use No    Review of Systems  Constitutional: Negative for fever. Eyes: Negative for visual changes. ENT: Negative for sore throat. Neck: No neck pain  Cardiovascular: Negative for chest pain. Respiratory: Negative  for shortness of breath. Gastrointestinal: + epigastric abdominal pain. No vomiting or diarrhea. Genitourinary: Negative for dysuria. Musculoskeletal: Negative for back pain. Skin: Negative for rash. Neurological: Negative for headaches, weakness or numbness. Psych: No SI or HI  ____________________________________________   PHYSICAL EXAM:  VITAL SIGNS: ED Triage Vitals  Enc Vitals Group     BP 05/31/16 2352 114/74     Pulse Rate 05/31/16 2352 79     Resp 05/31/16 2352 20     Temp 05/31/16 2352 98.4 F (36.9 C)     Temp Source 05/31/16 2352 Oral     SpO2 05/31/16 2352 100 %     Weight 05/31/16 2351 220 lb (99.8 kg)     Height 05/31/16 2351 5\' 4"  (1.626 m)     Head Circumference --      Peak Flow --      Pain Score 05/31/16 2350 10     Pain Loc --      Pain Edu? --      Excl. in GC? --     Constitutional: Alert and oriented. Well appearing and in no apparent distress. HEENT:      Head: Normocephalic and atraumatic.         Eyes: Conjunctivae are normal. Sclera is non-icteric.       Mouth/Throat: Mucous membranes are moist.       Neck: Supple with no signs of meningismus. Cardiovascular: Regular rate and rhythm. No murmurs, gallops, or rubs. 2+ symmetrical distal pulses are present in all extremities. No JVD. Respiratory: Normal respiratory effort. Lungs are clear  to auscultation bilaterally. No wheezes, crackles, or rhonchi.  Gastrointestinal: Soft, ttp over the RUQ and epigastric region with negative Murphy's sign, and non distended with positive bowel sounds. No rebound or guarding. Genitourinary: No CVA tenderness. Musculoskeletal: Nontender with normal range of motion in all extremities. No edema, cyanosis, or erythema of extremities. Neurologic: Normal speech and language. Face is symmetric. Moving all extremities. No gross focal neurologic deficits are appreciated. Skin: Skin is warm, dry and intact. No rash noted. Psychiatric: Mood and affect are normal. Speech and  behavior are normal.  ____________________________________________   LABS (all labs ordered are listed, but only abnormal results are displayed)  Labs Reviewed  COMPREHENSIVE METABOLIC PANEL - Abnormal; Notable for the following:       Result Value   Glucose, Bld 102 (*)    Creatinine, Ser 1.07 (*)    All other components within normal limits  CBC - Abnormal; Notable for the following:    WBC 13.5 (*)    All other components within normal limits  URINALYSIS, COMPLETE (UACMP) WITH MICROSCOPIC - Abnormal; Notable for the following:    Color, Urine YELLOW (*)    APPearance HAZY (*)    Leukocytes, UA SMALL (*)    Bacteria, UA RARE (*)    Squamous Epithelial / LPF 6-30 (*)    All other components within normal limits  LIPASE, BLOOD  POC URINE PREG, ED  POCT PREGNANCY, URINE   ____________________________________________  EKG  none  ____________________________________________  RADIOLOGY  RUQ US: Stones noted largely filling the gallbladder. Gallbladder otherwise grossly unremarkable in appearance.  ____________________________________________   PROCEDURES  Procedure(s) performed: None Procedures Critical Care performed:  None ____________________________________________   INITIAL IMPRESSION / ASSESSMENT AND PLAN / ED COURSE  21 y.o. female with a history of cholelithiasis who presents for evaluation of epigastric abdominal pain. Patient is well-appearing, in no distress, has normal vital signs, she is tender to palpation on the right upper quadrant and epigastric region with negative Murphy sign. Pregnancy test is negative. CBC showing leukocytosis with white count of 13.5. CMP and lipase with no acute findings. Will give toradol, IVF and get US to rule out cholecystitis.  Clinical Course as of Jun 01 441  Mon Jun 01, 2016  [id number] Ultrasound showing cholelithiasis with no evidence of cholecystitis. Blood work is within normal limits. Patient's pain fully resolved. She  is no longer tender on exam. She is going to be discharged home with follow-up with surgery as an outpatient for further management.  [CV]    Clinical Course User Index [CV] Nita SickleVeronese, [place], MD    Pertinent labs & imaging results that were available during my care of the patient were reviewed by me and considered in my medical decision making (see chart for details).    ____________________________________________   FINAL CLINICAL IMPRESSION(S) / ED DIAGNOSES  Final diagnoses:  RUQ abdominal pain  Calculus of gallbladder without cholecystitis without obstruction      NEW MEDICATIONS STARTED DURING THIS VISIT:  New Prescriptions   No medications on file     Note:  This document was prepared using Dragon voice recognition software and may include unintentional dictation errors.    Nita SickleVeronese, [place], MD 06/01/16 [phone]

## 2016-06-01 NOTE — ED Notes (Signed)
Pt's visitor up to desk stating pt is having chest pain and that should be pretty serious as that's where her heart is; says we need to take her back and check it out; over to speak with pt; she points to epigastric area; says she has a history of gallstones; understands she is the next person to go back to a room;

## 2016-06-02 LAB — URINE CULTURE

## 2016-06-03 ENCOUNTER — Other Ambulatory Visit: Payer: Self-pay

## 2016-06-09 ENCOUNTER — Ambulatory Visit (INDEPENDENT_AMBULATORY_CARE_PROVIDER_SITE_OTHER): Payer: BLUE CROSS/BLUE SHIELD | Admitting: Surgery

## 2016-06-09 ENCOUNTER — Telehealth: Payer: Self-pay | Admitting: Surgery

## 2016-06-09 ENCOUNTER — Encounter: Payer: Self-pay | Admitting: Surgery

## 2016-06-09 VITALS — Temp 98.0°F | Ht 63.0 in | Wt 228.0 lb

## 2016-06-09 DIAGNOSIS — K8020 Calculus of gallbladder without cholecystitis without obstruction: Secondary | ICD-10-CM

## 2016-06-09 NOTE — Progress Notes (Signed)
Alicia Weaver is an 21 y.o. female.   Chief Complaint: Gallstones HPI: This patient was in the emergency room with right upper quadrant pain. Workup showed gallstones on ultrasound with slightly elevated white blood cell count and normal LFTs. Pain is in the right upper quadrant and has been going on for 2 years and is episodic associated fatty food intolerance. She denies jaundice or acholic stools no fevers or chills and occasional nausea.  Past Medical History:  Diagnosis Date  . Cholelithiasis 10/11/2013  . Obesity affecting pregnancy 10/11/2013   Overview:  Pt on 81 mg ASA for PIH risk ( MFM) 15 # weight gain recommended   . Supervision of normal pregnancy 11/23/2013   Overview:   Factors complicating this pregnancy   Obesity  HGBA1C  10/23/13   5.4 Plan for twice weekly NST BMI 42.   Cholelithiasis  New onset HTN: BP 140/80, 144/88 on repeat. Yesterday. BP 160/90 in the office. PIH labs normal 3/18. Monitored on L&D for 6 hours. BP 105-126/63-78 making me think that 160/90 was erroneous value. Ms. Lyon's arm is large enough such that our cuff is borderl    No past surgical history on file.  Family History  Problem Relation Age of Onset  . Heart disease Mother   . Cancer Neg Hx    Social History:  reports that she has been smoking.  She has a 1.00 pack-year smoking history. She has never used smokeless tobacco. She reports that she does not drink alcohol or use drugs.  Allergies: No Known Allergies   (Not in a hospital admission)   Review of Systems:   Review of Systems  Constitutional: Negative.   HENT: Negative.   Eyes: Negative.   Respiratory: Negative.   Cardiovascular: Negative.   Gastrointestinal: Positive for abdominal pain. Negative for blood in stool, constipation, diarrhea, heartburn, nausea and vomiting.  Genitourinary: Negative.   Musculoskeletal: Negative.   Skin: Negative.   Neurological: Negative.   Endo/Heme/Allergies: Negative.     Physical  Exam:  Physical Exam  Constitutional: She is oriented to person, place, and time and well-developed, well-nourished, and in no distress.  Morbidly obese  HENT:  Head: Normocephalic and atraumatic.  Eyes: Pupils are equal, round, and reactive to light. Right eye exhibits no discharge. Left eye exhibits no discharge. No scleral icterus.  Neck: Normal range of motion.  Cardiovascular: Normal rate, regular rhythm and normal heart sounds.   Pulmonary/Chest: Effort normal and breath sounds normal. No respiratory distress. She has no wheezes. She has no rales.  Abdominal: Soft. She exhibits no distension. There is no tenderness. There is no rebound and no guarding.  Supraumbilical piercing scars which are no longer used per patient  Musculoskeletal: Normal range of motion. She exhibits no edema or tenderness.  Lymphadenopathy:    She has no cervical adenopathy.  Neurological: She is alert and oriented to person, place, and time.  Skin: Skin is warm and dry. No rash noted. She is not diaphoretic. No erythema.  Psychiatric: Mood and affect normal.  Vitals reviewed.   Temperature 98 F (36.7 C), temperature source Oral, height 5\' 3"  (1.6 m), weight 228 lb (103.4 kg).    No results found for this or any previous visit (from the past 48 hour(s)). No results found.   Assessment/Plan Gallstones in 2 years of biliary colic. Recommend laparoscopic cholecystectomy for control her symptoms. The rationale for this was discussed the options of observation reviewed the risk bleeding infection recurrence failure to resolve  all symptoms conversion to an open procedure bile duct or bowel injury were all reviewed with her she understood and agreed to proceed family was present.  Lattie Hawichard E Kunta Hilleary, MD, FACS

## 2016-06-09 NOTE — Telephone Encounter (Signed)
I have called patient to advised of pre op date/time and sx date. No answer.  Sx: 06/23/16 with Dr Ludwig Clarksooper--Laparoscopic cholecystectomy.  Pre op: 06/16/16 between 9-1:00pm--Phone  Patient made aware to call [phone], between 1-3:00pm the day before surgery, to find out what time to arrive.    Physician estimate: [phone]

## 2016-06-09 NOTE — Patient Instructions (Signed)
We have scheduled your surgery for 06/23/16 at [facility] with Dr.Richard Excell Seltzerooper. Please see the blue pre-care sheet for surgery information. Please call our office if you have questions or concerns.

## 2016-06-10 NOTE — Telephone Encounter (Signed)
I have tried contacting patient on both contacts in chart. I have left a voicemail on primary number and was not able to leave a voicemail on the emergency contact.   I will try back.

## 2016-06-11 NOTE — Telephone Encounter (Signed)
I have tried calling patient again to inform her of her surgery information. No answer with emergency contact--just rang, not able to leave a message on voicemail. I have left another voicemail on primary number in chart.

## 2016-06-16 ENCOUNTER — Inpatient Hospital Stay
Admission: RE | Admit: 2016-06-16 | Discharge: 2016-06-16 | Disposition: A | Discharge status: Home / Self Care | Payer: BLUE CROSS/BLUE SHIELD | Source: Ambulatory Visit

## 2016-06-22 ENCOUNTER — Telehealth: Payer: Self-pay | Admitting: Surgery

## 2016-06-22 NOTE — Telephone Encounter (Signed)
Patient has been advised that Surgery has been canceled due to being non compliant with appointments prior to surgery. Patient stated that she will call back to reschedule.   She states that she can does not have the funds for surgery at this time.

## 2016-06-23 ENCOUNTER — Encounter: Admission: RE | Payer: Self-pay | Source: Ambulatory Visit

## 2016-06-23 ENCOUNTER — Ambulatory Visit: Admission: RE | Admit: 2016-06-23 | Payer: BLUE CROSS/BLUE SHIELD | Source: Ambulatory Visit | Admitting: Surgery

## 2016-06-23 SURGERY — LAPAROSCOPIC CHOLECYSTECTOMY
Anesthesia: General

## 2016-07-15 ENCOUNTER — Emergency Department
Admission: EM | Admit: 2016-07-15 | Discharge: 2016-07-15 | Disposition: A | Discharge status: Home / Self Care | Payer: BLUE CROSS/BLUE SHIELD | Attending: Emergency Medicine | Admitting: Emergency Medicine

## 2016-07-15 ENCOUNTER — Emergency Department: Payer: BLUE CROSS/BLUE SHIELD

## 2016-07-15 ENCOUNTER — Encounter: Payer: Self-pay | Admitting: Radiology

## 2016-07-15 DIAGNOSIS — F17200 Nicotine dependence, unspecified, uncomplicated: Secondary | ICD-10-CM | POA: Diagnosis not present

## 2016-07-15 DIAGNOSIS — R042 Hemoptysis: Secondary | ICD-10-CM | POA: Insufficient documentation

## 2016-07-15 DIAGNOSIS — R002 Palpitations: Secondary | ICD-10-CM | POA: Diagnosis not present

## 2016-07-15 DIAGNOSIS — R1012 Left upper quadrant pain: Secondary | ICD-10-CM | POA: Diagnosis not present

## 2016-07-15 DIAGNOSIS — R1010 Upper abdominal pain, unspecified: Secondary | ICD-10-CM

## 2016-07-15 LAB — DIFFERENTIAL
BASOS ABS: 0.1 10*3/uL (ref 0–0.1)
Basophils Relative: 1 %
Eosinophils Absolute: 0.1 10*3/uL (ref 0–0.7)
Eosinophils Relative: 1 %
LYMPHS ABS: 2.3 10*3/uL (ref 1.0–3.6)
LYMPHS PCT: 23 %
Monocytes Absolute: 0.9 10*3/uL (ref 0.2–0.9)
Monocytes Relative: 9 %
NEUTROS ABS: 6.8 10*3/uL — AB (ref 1.4–6.5)
NEUTROS PCT: 68 %

## 2016-07-15 LAB — COMPREHENSIVE METABOLIC PANEL
ALT: 29 U/L (ref 14–54)
AST: 22 U/L (ref 15–41)
Albumin: 4.4 g/dL (ref 3.5–5.0)
Alkaline Phosphatase: 56 U/L (ref 38–126)
Anion gap: 9 (ref 5–15)
BILIRUBIN TOTAL: 0.6 mg/dL (ref 0.3–1.2)
BUN: 24 mg/dL — AB (ref 6–20)
CO2: 26 mmol/L (ref 22–32)
CREATININE: 0.94 mg/dL (ref 0.44–1.00)
Calcium: 9.5 mg/dL (ref 8.9–10.3)
Chloride: 104 mmol/L (ref 101–111)
Glucose, Bld: 96 mg/dL (ref 65–99)
POTASSIUM: 4.2 mmol/L (ref 3.5–5.1)
Sodium: 139 mmol/L (ref 135–145)
Total Protein: 7.5 g/dL (ref 6.5–8.1)

## 2016-07-15 LAB — URINALYSIS, COMPLETE (UACMP) WITH MICROSCOPIC
Bacteria, UA: NONE SEEN
Bilirubin Urine: NEGATIVE
Glucose, UA: NEGATIVE mg/dL
Ketones, ur: NEGATIVE mg/dL
Leukocytes, UA: NEGATIVE
Nitrite: NEGATIVE
Protein, ur: NEGATIVE mg/dL
SPECIFIC GRAVITY, URINE: 1.024 (ref 1.005–1.030)
pH: 5 (ref 5.0–8.0)

## 2016-07-15 LAB — CBC
HCT: 42.4 % (ref 35.0–47.0)
HEMOGLOBIN: 14.5 g/dL (ref 12.0–16.0)
MCH: 30 pg (ref 26.0–34.0)
MCHC: 34.2 g/dL (ref 32.0–36.0)
MCV: 87.8 fL (ref 80.0–100.0)
Platelets: 273 10*3/uL (ref 150–440)
RBC: 4.83 MIL/uL (ref 3.80–5.20)
RDW: 13.3 % (ref 11.5–14.5)
WBC: 10.3 10*3/uL (ref 3.6–11.0)

## 2016-07-15 LAB — PREGNANCY, URINE: PREG TEST UR: NEGATIVE

## 2016-07-15 LAB — POCT RAPID STREP A: Streptococcus, Group A Screen (Direct): NEGATIVE

## 2016-07-15 LAB — LIPASE, BLOOD: LIPASE: 43 U/L (ref 11–51)

## 2016-07-15 MED ORDER — IOPAMIDOL (ISOVUE-370) INJECTION 76%
50.0000 mL | Freq: Once | INTRAVENOUS | Status: AC | PRN
  Administered 2016-07-15: 50 mL via INTRAVENOUS

## 2016-07-15 MED ORDER — IOPAMIDOL (ISOVUE-300) INJECTION 61%
100.0000 mL | Freq: Once | INTRAVENOUS | Status: AC | PRN
  Administered 2016-07-15: 100 mL via INTRAVENOUS

## 2016-07-15 MED ORDER — IOPAMIDOL (ISOVUE-300) INJECTION 61%
30.0000 mL | Freq: Once | INTRAVENOUS | Status: AC | PRN
  Administered 2016-07-15: 30 mL via ORAL

## 2016-07-15 NOTE — Discharge Instructions (Signed)
Everything we've done here looks okay. Please follow-up with your regular doctor or see the [facility] or the [facility] or the [facility] or the [facility] or [place]. Please return for worse pain fever vomiting or feeling sicker.

## 2016-07-15 NOTE — ED Provider Notes (Addendum)
[facility] Emergency Department Provider Note   ____________________________________________   First MD Initiated Contact with Patient 07/15/16 1137     (approximate)  I have reviewed the triage vital signs and the nursing notes.   HISTORY  Chief Complaint Abdominal Pain   HPI Alicia Weaver is a 21 y.o. female patient reports left upper quadrant pain since delivering her baby in 2016. She is hasn't had the time or money to get it checked. She says the pain is worse with deep breathing worse with palpation and she has coughed up small amounts of blood in the past and again today. Pain is moderate, achy also worse with movement no nausea vomiting or diarrhea  Past Medical History:  Diagnosis Date  . Cholelithiasis 10/11/2013  . Obesity affecting pregnancy 10/11/2013   Overview:  Pt on 81 mg ASA for PIH risk ( MFM) 15 # weight gain recommended   . Supervision of normal pregnancy 11/23/2013   Overview:   Factors complicating this pregnancy   Obesity  HGBA1C  10/23/13   5.4 Plan for twice weekly NST BMI 42.   Cholelithiasis  New onset HTN: BP 140/80, 144/88 on repeat. Yesterday. BP 160/90 in the office. PIH labs normal 3/18. Monitored on L&D for 6 hours. BP 105-126/63-78 making me think that 160/90 was erroneous value. Ms. Kamer's arm is large enough such that our cuff is borderl    Patient Active Problem List   Diagnosis Date Noted  . Supervision of normal pregnancy 11/23/2013  . Cholelithiasis 10/11/2013  . Obesity affecting pregnancy 10/11/2013    No past surgical history on file.  Prior to Admission medications   Not on File    Allergies Patient has no known allergies.  Family History  Problem Relation Age of Onset  . Heart disease Mother   . Cancer Neg Hx     Social History Social History  Substance Use Topics  . Smoking status: Current Every Day Smoker    Packs/day: 1.00    Years: 1.00  . Smokeless tobacco: Never Used  .  Alcohol use No    Review of Systems  Constitutional: No fever/chills Eyes: No visual changes. ENT: No sore throat. Cardiovascular: Denies chest pain. Respiratory: Denies shortness of breath. Gastrointestinal: See history of present illness Genitourinary: Negative for dysuria. Musculoskeletal: Negative for back pain. Skin: Negative for rash. Neurological: Negative for headaches, focal weakness or numbness.   ____________________________________________   PHYSICAL EXAM:  VITAL SIGNS: ED Triage Vitals  Enc Vitals Group     BP 07/15/16 1036 117/73     Pulse Rate 07/15/16 1036 66     Resp 07/15/16 1036 15     Temp 07/15/16 1036 98.2 F (36.8 C)     Temp Source 07/15/16 1036 Oral     SpO2 07/15/16 1036 98 %     Weight 07/15/16 1038 223 lb (101.2 kg)     Height 07/15/16 1038 5\' 4"  (1.626 m)     Head Circumference --      Peak Flow --      Pain Score 07/15/16 1049 8     Pain Loc --      Pain Edu? --      Excl. in GC? --     Constitutional: Alert and oriented. Well appearing and in no acute distress. Eyes: Conjunctivae are normal. PERRL. EOMI. Head: Atraumatic. Nose: No congestion/rhinnorhea. Mouth/Throat: Mucous membranes are moist.  Oropharynx non-erythematous. Neck: No stridor.  Cardiovascular: Normal rate, regular rhythm.  Grossly normal heart sounds.  Good peripheral circulation. Respiratory: Normal respiratory effort.  No retractions. Lungs CTAB. Gastrointestinal: Soft Tender especially in the left upper quadrant to palpation No distention. No abdominal bruits. No CVA tenderness. Musculoskeletal: No lower extremity tenderness nor edema.  No joint effusions. Neurologic:  Normal speech and language. No gross focal neurologic deficits are appreciated. No gait instability. Skin:  Skin is warm, dry and intact. No rash noted. Psychiatric: Mood and affect are normal. Speech and behavior are normal.  ____________________________________________   LABS (all labs ordered  are listed, but only abnormal results are displayed)  Labs Reviewed  COMPREHENSIVE METABOLIC PANEL - Abnormal; Notable for the following:       Result Value   BUN 24 (*)    All other components within normal limits  URINALYSIS, COMPLETE (UACMP) WITH MICROSCOPIC - Abnormal; Notable for the following:    Color, Urine YELLOW (*)    APPearance CLEAR (*)    Hgb urine dipstick LARGE (*)    Squamous Epithelial / LPF 0-5 (*)    All other components within normal limits  DIFFERENTIAL - Abnormal; Notable for the following:    Neutro Abs 6.8 (*)    All other components within normal limits  LIPASE, BLOOD  CBC  PREGNANCY, URINE  POCT RAPID STREP A   ____________________________________________  EKG  ________________________________________  RADIOLOGY  IMPRESSION: CT angiogram chest: No demonstrable pulmonary embolus. No thoracic aortic dissection.  Lungs clear. No evident adenopathy. There is a small hiatal hernia.  CT abdomen and pelvis:  1. No bowel wall thickening or bowel obstruction. No abscess. Appendix appears normal.  2. No renal or ureteral calculus. No hydronephrosis. Urinary bladder midline with wall thickness normal.  3. Small ventral hernia containing only fat. Hiatal hernia present.   Electronically Signed   By: Bretta Bang III M.D.   On: 07/15/2016 14:44  ____________________________________________   PROCEDURES  Procedure(s) performed:  Procedures  Critical Care performed:   ____________________________________________   INITIAL IMPRESSION / ASSESSMENT AND PLAN / ED COURSE  Pertinent labs & imaging results that were available during my care of the patient were reviewed by me and considered in my medical decision making (see chart for details).       ____________________________________________   FINAL CLINICAL IMPRESSION(S) / ED DIAGNOSES  Final diagnoses:  Pain of upper abdomen      NEW MEDICATIONS STARTED DURING  THIS VISIT:  New Prescriptions   No medications on file     Note:  This document was prepared using Dragon voice recognition software and may include unintentional dictation errors.    Arnaldo Natal, MD 07/15/16 1650    Arnaldo Natal, MD 07/15/16 [phone]

## 2016-07-15 NOTE — ED Notes (Signed)
Pt left prior to discharge instruction review. Pt in NAD when discharge order placed.

## 2016-07-15 NOTE — ED Notes (Signed)
Pt left prior to discharge instructions being reviewed. RN unable to obtain signature for discharge.

## 2016-07-15 NOTE — ED Triage Notes (Signed)
Pt reports LUQ pain since this am, reports hx of pain with gallbladder. Pt also reports coughing up small amounts of blood.

## 2016-10-14 ENCOUNTER — Encounter: Payer: Self-pay | Admitting: Surgery

## 2016-10-14 ENCOUNTER — Ambulatory Visit (INDEPENDENT_AMBULATORY_CARE_PROVIDER_SITE_OTHER): Payer: BLUE CROSS/BLUE SHIELD | Admitting: Surgery

## 2016-10-14 ENCOUNTER — Encounter: Payer: BLUE CROSS/BLUE SHIELD | Admitting: Certified Nurse Midwife

## 2016-10-14 VITALS — BP 118/63 | HR 66 | Temp 98.2°F | Ht 63.0 in | Wt 226.0 lb

## 2016-10-14 DIAGNOSIS — K8020 Calculus of gallbladder without cholecystitis without obstruction: Secondary | ICD-10-CM | POA: Diagnosis not present

## 2016-10-14 NOTE — Progress Notes (Signed)
Outpatient Surgical Follow Up  10/14/2016  Alicia Weaver is an 21 y.o. female.   CC: Biliary colic  HPI: This a patient seen previously with biliary colic. She was scheduled for surgery back in June. That was canceled for unclear reasons. She's been to the emergency room since then. She continues to smoke.  Past Medical History:  Diagnosis Date  . Cholelithiasis 10/11/2013  . Obesity affecting pregnancy 10/11/2013   Overview:  Pt on 81 mg ASA for PIH risk ( MFM) 15 # weight gain recommended   . Supervision of normal pregnancy 11/23/2013   Overview:   Factors complicating this pregnancy   Obesity  HGBA1C  10/23/13   5.4 Plan for twice weekly NST BMI 42.   Cholelithiasis  New onset HTN: BP 140/80, 144/88 on repeat. Yesterday. BP 160/90 in the office. [place] labs normal 3/18. Monitored on L&D for 6 hours. BP 105-126/63-78 making me think that 160/90 was erroneous value. Ms. Uehara's arm is large enough such that our cuff is borderl    History reviewed. No pertinent surgical history.  Family History  Problem Relation Age of Onset  . Heart disease Mother   . Cancer Neg Hx     Social History:  reports that she has been smoking.  She has a 1.00 pack-year smoking history. She has never used smokeless tobacco. She reports that she does not drink alcohol or use drugs.  Allergies: No Known Allergies  Medications reviewed.   Review of Systems:   Review of Systems  Constitutional: Negative.   HENT: Negative.   Eyes: Negative.   Respiratory: Negative.   Cardiovascular: Negative.   Gastrointestinal: Positive for abdominal pain and nausea. Negative for blood in stool, constipation, diarrhea, heartburn and vomiting.  Genitourinary: Negative.   Musculoskeletal: Negative.   Skin: Negative.   Neurological: Negative.   Endo/Heme/Allergies: Negative.   Psychiatric/Behavioral: Negative.      Physical Exam:  There were no vitals taken for this visit.  Physical Exam   Constitutional: She is oriented to person, place, and time and well-developed, well-nourished, and in no distress. No distress.  Morbidly obese  HENT:  Head: Normocephalic and atraumatic.  Eyes: Pupils are equal, round, and reactive to light. Right eye exhibits no discharge. Left eye exhibits no discharge. No scleral icterus.  Neck: Normal range of motion.  Cardiovascular: Normal rate, regular rhythm and normal heart sounds.   Pulmonary/Chest: Effort normal and breath sounds normal. No respiratory distress. She has no wheezes. She has no rales.  Abdominal: Soft. She exhibits no distension. There is no tenderness. There is no rebound and no guarding.  Musculoskeletal: Normal range of motion. She exhibits no edema or tenderness.  Lymphadenopathy:    She has no cervical adenopathy.  Neurological: She is alert and oriented to person, place, and time.  Skin: Skin is warm and dry. No rash noted. She is not diaphoretic. No erythema.  Psychiatric: Mood and affect normal.  Vitals reviewed.     No results found for this or any previous visit (from the past 48 hour(s)). No results found.  Assessment/Plan:  Prior workup and shown gallstones and she was scheduled for surgery in June. She canceled surgery because she could not afford the co-pay. Not met her to D duct double She has continued to have abdominal pain classic for biliary colic. I discussed with her again the rationale for offering surgery and the options of observation risk bleeding infection recurrence of symptoms failure to resolve all her symptoms conversion  to an open procedure bile duct injury bowel injury or leak any of which could require further surgery she understood and agreed to proceed  Florene Glen, MD, FACS

## 2016-10-14 NOTE — Patient Instructions (Signed)
You have requested to have your gallbladder removed. This will be done on 11/6/18at [facility] with Dr. Dionne Milo.  You will most likely be out of work 1-2 weeks for this surgery. You will return after your post-op appointment with a lifting restriction for approximately 4 more weeks.  You will be able to eat anything you would like to following surgery. But, start by eating a bland diet and advance this as tolerated. The Gallbladder diet is below, please go as closely by this diet as possible prior to surgery to avoid any further attacks.  Please see the (blue)pre-care form that you have been given today. If you have any questions, please call our office.  Laparoscopic Cholecystectomy Laparoscopic cholecystectomy is surgery to remove the gallbladder. The gallbladder is located in the upper right part of the abdomen, behind the liver. It is a storage sac for bile, which is produced in the liver. Bile aids in the digestion and absorption of fats. Cholecystectomy is often done for inflammation of the gallbladder (cholecystitis). This condition is usually caused by a buildup of gallstones (cholelithiasis) in the gallbladder. Gallstones can block the flow of bile, and that can result in inflammation and pain. In severe cases, emergency surgery may be required. If emergency surgery is not required, you will have time to prepare for the procedure. Laparoscopic surgery is an alternative to open surgery. Laparoscopic surgery has a shorter recovery time. Your common bile duct may also need to be examined during the procedure. If stones are found in the common bile duct, they may be removed. LET [facility] CARE PROVIDER KNOW ABOUT:  Any allergies you have.  All medicines you are taking, including vitamins, herbs, eye drops, creams, and over-the-counter medicines.  Previous problems you or members of your family have had with the use of anesthetics.  Any blood disorders you have.  Previous  surgeries you have had.    Any medical conditions you have. RISKS AND COMPLICATIONS Generally, this is a safe procedure. However, problems may occur, including:  Infection.  Bleeding.  Allergic reactions to medicines.  Damage to other structures or organs.  A stone remaining in the common bile duct.  A bile leak from the cyst duct that is clipped when your gallbladder is removed.  The need to convert to open surgery, which requires a larger incision in the abdomen. This may be necessary if your surgeon thinks that it is not safe to continue with a laparoscopic procedure. BEFORE THE PROCEDURE  Ask your health care provider about:  Changing or stopping your regular medicines. This is especially important if you are taking diabetes medicines or blood thinners.  Taking medicines such as aspirin and ibuprofen. These medicines can thin your blood. Do not take these medicines before your procedure if your health care provider instructs you not to.  Follow instructions from your health care provider about eating or drinking restrictions.  Let your health care provider know if you develop a cold or an infection before surgery.  Plan to have someone take you home after the procedure.  Ask your health care provider how your surgical site will be marked or identified.  You may be given antibiotic medicine to help prevent infection. PROCEDURE  To reduce your risk of infection:  Your health care team will wash or sanitize their hands.  Your skin will be washed with soap.  An IV tube may be inserted into one of your veins.  You will be given a medicine to make  you fall asleep (general anesthetic).  A breathing tube will be placed in your mouth.  The surgeon will make several small cuts (incisions) in your abdomen.  A thin, lighted tube (laparoscope) that has a tiny camera on the end will be inserted through one of the small incisions. The camera on the laparoscope will send a  picture to a TV screen (monitor) in the operating room. This will give the surgeon a good view inside your abdomen.  A gas will be pumped into your abdomen. This will expand your abdomen to give the surgeon more room to perform the surgery.  Other tools that are needed for the procedure will be inserted through the other incisions. The gallbladder will be removed through one of the incisions.  After your gallbladder has been removed, the incisions will be closed with stitches (sutures), staples, or skin glue.  Your incisions may be covered with a bandage (dressing). The procedure may vary among health care providers and hospitals. AFTER THE PROCEDURE  Your blood pressure, heart rate, breathing rate, and blood oxygen level will be monitored often until the medicines you were given have worn off.  You will be given medicines as needed to control your pain.   This information is not intended to replace advice given to you by your health care provider. Make sure you discuss any questions you have with your health care provider.   Document Released: 12/22/2004 Document Revised: 09/12/2014 Document Reviewed: 08/03/2012 Elsevier Interactive Patient Education 2016 [place] Diet for Gallbladder Conditions A low-fat diet can be helpful if you have pancreatitis or a gallbladder condition. With these conditions, your pancreas and gallbladder have trouble digesting fats. A healthy eating plan with less fat will help rest your pancreas and gallbladder and reduce your symptoms. WHAT DO I NEED TO KNOW ABOUT THIS DIET?  Eat a low-fat diet.  Reduce your fat intake to less than 20-30% of your total daily calories. This is less than 50-60 g of fat per day.  Remember that you need some fat in your diet. Ask your dietician what your daily goal should be.  Choose nonfat and low-fat healthy foods. Look for the words "nonfat," "low fat," or "fat free."  As a guide, look on the label and  choose foods with less than 3 g of fat per serving. Eat only one serving.  Avoid alcohol.  Do not smoke. If you need help quitting, talk with your health care provider.  Eat small frequent meals instead of three large heavy meals. WHAT FOODS CAN I EAT? Grains Include healthy grains and starches such as potatoes, wheat bread, fiber-rich cereal, and brown rice. Choose whole grain options whenever possible. In adults, whole grains should account for 45-65% of your daily calories.  Fruits and Vegetables Eat plenty of fruits and vegetables. Fresh fruits and vegetables add fiber to your diet. Meats and Other Protein Sources Eat lean meat such as chicken and pork. Trim any fat off of meat before cooking it. Eggs, fish, and beans are other sources of protein. In adults, these foods should account for 10-35% of your daily calories. Dairy Choose low-fat milk and dairy options. Dairy includes fat and protein, as well as calcium.  Fats and Oils Limit high-fat foods such as fried foods, sweets, baked goods, sugary drinks.  Other Creamy sauces and condiments, such as mayonnaise, can add extra fat. Think about whether or not you need to use them, or use smaller amounts or low fat options.  WHAT FOODS ARE NOT RECOMMENDED?  High fat foods, such as:  [employer].  Ice cream.  [place] toast.  Sweet rolls.  Pizza.  Cheese bread.  Foods covered with batter, butter, creamy sauces, or cheese.  Fried foods.  Sugary drinks and desserts.  Foods that cause gas or bloating   This information is not intended to replace advice given to you by your health care provider. Make sure you discuss any questions you have with your health care provider.   Document Released: 12/27/2012 Document Reviewed: 12/27/2012 Elsevier Interactive Patient Education [employer].

## 2016-10-15 ENCOUNTER — Telehealth: Payer: Self-pay | Admitting: Surgery

## 2016-10-15 NOTE — Telephone Encounter (Signed)
Pt advised of pre op date/time and sx date. Sx: 11/10/16 with Dr Ludwig Clarks cholecystectomy.  Pre op: 11/03/16 between 9-1:00pm--Phone interview.   Patient made aware to call [phone], between 1-3:00pm the day before surgery, to find out what time to arrive.

## 2016-11-03 ENCOUNTER — Encounter
Admission: RE | Admit: 2016-11-03 | Discharge: 2016-11-03 | Disposition: A | Discharge status: Home / Self Care | Payer: BLUE CROSS/BLUE SHIELD | Source: Ambulatory Visit | Attending: Surgery | Admitting: Surgery

## 2016-11-03 NOTE — Patient Instructions (Signed)
  Your procedure is scheduled on: 11-10-16 TUESDAY Report to Same Day Surgery 2nd floor medical mall [facility](Medical Mall Entrance-take elevator on left to 2nd floor.  Check in with surgery information desk.) To find out your arrival time please call [phone] between 1PM - 3PM on 11-09-16 MONDAY  Remember: Instructions that are not followed completely may result in serious medical risk, up to and including death, or upon the discretion of your surgeon and anesthesiologist your surgery may need to be rescheduled.    _x___ 1. Do not eat food after midnight the night before your procedure. NO GUM CHEWING OR HARD CANDIES.  You may drink clear liquids up to 2 hours before you are scheduled to arrive at the hospital for your procedure.  Do not drink clear liquids within 2 hours of your scheduled arrival to the hospital.  Clear liquids include  --Water or Apple juice without pulp  --Clear carbohydrate beverage such as ClearFast or Gatorade  --Black Coffee or Clear Tea (No milk, no creamers, do not add anything to the coffee or Tea)      __x__ 2. No Alcohol for 24 hours before or after surgery.   __x__3. No Smoking for 24 prior to surgery.   ____  4. Bring all medications with you on the day of surgery if instructed.    __x__ 5. Notify your doctor if there is any change in your medical condition     (cold, fever, infections).     Do not wear jewelry, make-up, hairpins, clips or nail polish.  Do not wear lotions, powders, or perfumes. You may wear deodorant.  Do not shave 48 hours prior to surgery. Men may shave face and neck.  Do not bring valuables to the hospital.    [facility] is not responsible for any belongings or valuables.               Contacts, dentures or bridgework may not be worn into surgery.  Leave your suitcase in the car. After surgery it may be brought to your room.  For patients admitted to the hospital, discharge time is determined by your treatment team   Patients  discharged the day of surgery will not be allowed to drive home.  You will need someone to drive you home and stay with you the night of your procedure.    Please read over the following fact sheets that you were given:   [facility] Preparing for Surgery and or MRSA Information   ____ Take anti-hypertensive listed below, cardiac, seizure, asthma, anti-reflux and psychiatric medicines. These include:  1. NONE  2.  3.  4.  5.  6.  ____Fleets enema or Magnesium Citrate as directed.   _x___ Use CHG Soap or sage wipes as directed on instruction sheet   ____ Use inhalers on the day of surgery and bring to hospital day of surgery  ____ Stop Metformin and Janumet 2 days prior to surgery.    ____ Take 1/2 of usual insulin dose the night before surgery and none on the morning surgery.   ____ Follow recommendations from Cardiologist, Pulmonologist or PCP regarding stopping Aspirin, Coumadin, Plavix ,Eliquis, Effient, or Pradaxa, and Pletal.  X____Stop Anti-inflammatories such as Advil, Aleve, Ibuprofen, Motrin, Naproxen, Naprosyn, Goodies powders or aspirin products NOW-OK to take Tylenol    ____ Stop supplements until after surgery.     ____ Bring C-Pap to the hospital.

## 2016-11-05 ENCOUNTER — Inpatient Hospital Stay: Admission: RE | Admit: 2016-11-05 | Payer: BLUE CROSS/BLUE SHIELD | Source: Ambulatory Visit

## 2016-11-09 ENCOUNTER — Encounter
Admission: RE | Admit: 2016-11-09 | Discharge: 2016-11-09 | Disposition: A | Discharge status: Home / Self Care | Payer: BLUE CROSS/BLUE SHIELD | Source: Ambulatory Visit | Attending: Surgery | Admitting: Surgery

## 2016-11-09 DIAGNOSIS — Z6841 Body Mass Index (BMI) 40.0 and over, adult: Secondary | ICD-10-CM | POA: Diagnosis not present

## 2016-11-09 DIAGNOSIS — F17210 Nicotine dependence, cigarettes, uncomplicated: Secondary | ICD-10-CM | POA: Diagnosis not present

## 2016-11-09 DIAGNOSIS — K8050 Calculus of bile duct without cholangitis or cholecystitis without obstruction: Secondary | ICD-10-CM | POA: Diagnosis present

## 2016-11-09 DIAGNOSIS — K8010 Calculus of gallbladder with chronic cholecystitis without obstruction: Secondary | ICD-10-CM | POA: Diagnosis not present

## 2016-11-09 LAB — CBC WITH DIFFERENTIAL/PLATELET
Basophils Absolute: 0 10*3/uL (ref 0–0.1)
Basophils Relative: 1 %
Eosinophils Absolute: 0.1 10*3/uL (ref 0–0.7)
Eosinophils Relative: 1 %
HCT: 44.3 % (ref 35.0–47.0)
HEMOGLOBIN: 14.8 g/dL (ref 12.0–16.0)
LYMPHS ABS: 1.8 10*3/uL (ref 1.0–3.6)
LYMPHS PCT: 24 %
MCH: 30.4 pg (ref 26.0–34.0)
MCHC: 33.5 g/dL (ref 32.0–36.0)
MCV: 90.9 fL (ref 80.0–100.0)
Monocytes Absolute: 0.7 10*3/uL (ref 0.2–0.9)
Monocytes Relative: 10 %
NEUTROS ABS: 4.9 10*3/uL (ref 1.4–6.5)
NEUTROS PCT: 64 %
Platelets: 232 10*3/uL (ref 150–440)
RBC: 4.87 MIL/uL (ref 3.80–5.20)
RDW: 13.2 % (ref 11.5–14.5)
WBC: 7.6 10*3/uL (ref 3.6–11.0)

## 2016-11-09 LAB — COMPREHENSIVE METABOLIC PANEL
ALT: 30 U/L (ref 14–54)
ANION GAP: 8 (ref 5–15)
AST: 23 U/L (ref 15–41)
Albumin: 4.2 g/dL (ref 3.5–5.0)
Alkaline Phosphatase: 55 U/L (ref 38–126)
BUN: 13 mg/dL (ref 6–20)
CALCIUM: 9.3 mg/dL (ref 8.9–10.3)
CO2: 25 mmol/L (ref 22–32)
Chloride: 105 mmol/L (ref 101–111)
Creatinine, Ser: 0.9 mg/dL (ref 0.44–1.00)
GFR calc non Af Amer: >60 mL/min (ref >60)
GLUCOSE: 78 mg/dL (ref 65–99)
POTASSIUM: 3.8 mmol/L (ref 3.5–5.1)
SODIUM: 138 mmol/L (ref 135–145)
TOTAL PROTEIN: 7.4 g/dL (ref 6.5–8.1)
Total Bilirubin: 1.1 mg/dL (ref 0.3–1.2)

## 2016-11-09 MED ORDER — CEFAZOLIN SODIUM-DEXTROSE 2-4 GM/100ML-% IV SOLN
2.0000 g | INTRAVENOUS | Status: AC
  Administered 2016-11-10: 2 g via INTRAVENOUS

## 2016-11-10 ENCOUNTER — Ambulatory Visit: Payer: BLUE CROSS/BLUE SHIELD | Admitting: Anesthesiology

## 2016-11-10 ENCOUNTER — Ambulatory Visit
Admission: RE | Admit: 2016-11-10 | Discharge: 2016-11-10 | Disposition: A | Discharge status: Home / Self Care | Payer: BLUE CROSS/BLUE SHIELD | Source: Ambulatory Visit | Attending: Surgery | Admitting: Surgery

## 2016-11-10 ENCOUNTER — Encounter
Admission: RE | Disposition: A | Discharge status: Home / Self Care | Payer: Self-pay | Source: Ambulatory Visit | Attending: Surgery

## 2016-11-10 DIAGNOSIS — K8012 Calculus of gallbladder with acute and chronic cholecystitis without obstruction: Secondary | ICD-10-CM

## 2016-11-10 DIAGNOSIS — K8010 Calculus of gallbladder with chronic cholecystitis without obstruction: Secondary | ICD-10-CM | POA: Diagnosis not present

## 2016-11-10 DIAGNOSIS — F17210 Nicotine dependence, cigarettes, uncomplicated: Secondary | ICD-10-CM | POA: Insufficient documentation

## 2016-11-10 DIAGNOSIS — Z6841 Body Mass Index (BMI) 40.0 and over, adult: Secondary | ICD-10-CM | POA: Insufficient documentation

## 2016-11-10 DIAGNOSIS — K8050 Calculus of bile duct without cholangitis or cholecystitis without obstruction: Secondary | ICD-10-CM

## 2016-11-10 HISTORY — PX: CHOLECYSTECTOMY: SHX55

## 2016-11-10 LAB — POCT PREGNANCY, URINE: PREG TEST UR: NEGATIVE

## 2016-11-10 SURGERY — LAPAROSCOPIC CHOLECYSTECTOMY
Anesthesia: General

## 2016-11-10 MED ORDER — SUGAMMADEX SODIUM 200 MG/2ML IV SOLN
INTRAVENOUS | Status: DC | PRN
  Administered 2016-11-10: 200 mg via INTRAVENOUS

## 2016-11-10 MED ORDER — HEPARIN SODIUM (PORCINE) 5000 UNIT/ML IJ SOLN
5000.0000 [IU] | Freq: Once | INTRAMUSCULAR | Status: AC
  Administered 2016-11-10: 5000 [IU] via SUBCUTANEOUS

## 2016-11-10 MED ORDER — BUPIVACAINE-EPINEPHRINE (PF) 0.25% -1:200000 IJ SOLN
INTRAMUSCULAR | Status: DC | PRN
  Administered 2016-11-10: 30 mL

## 2016-11-10 MED ORDER — KETOROLAC TROMETHAMINE 30 MG/ML IJ SOLN
INTRAMUSCULAR | Status: DC | PRN
  Administered 2016-11-10: 30 mg via INTRAVENOUS

## 2016-11-10 MED ORDER — MIDAZOLAM HCL 2 MG/2ML IJ SOLN
INTRAMUSCULAR | Status: AC
  Filled 2016-11-10: qty 2

## 2016-11-10 MED ORDER — ROCURONIUM BROMIDE 100 MG/10ML IV SOLN
INTRAVENOUS | Status: DC | PRN
  Administered 2016-11-10: 50 mg via INTRAVENOUS
  Administered 2016-11-10 (×2): 10 mg via INTRAVENOUS

## 2016-11-10 MED ORDER — MIDAZOLAM HCL 2 MG/2ML IJ SOLN
INTRAMUSCULAR | Status: DC | PRN
  Administered 2016-11-10: 2 mg via INTRAVENOUS

## 2016-11-10 MED ORDER — CEFAZOLIN SODIUM-DEXTROSE 2-4 GM/100ML-% IV SOLN
INTRAVENOUS | Status: AC
  Filled 2016-11-10: qty 100

## 2016-11-10 MED ORDER — CHLORHEXIDINE GLUCONATE CLOTH 2 % EX PADS: 6.0000 | MEDICATED_PAD | Freq: Once | CUTANEOUS | Status: DC

## 2016-11-10 MED ORDER — PROMETHAZINE HCL 25 MG/ML IJ SOLN: 6.2500 mg | INTRAMUSCULAR | Status: DC | PRN

## 2016-11-10 MED ORDER — KETOROLAC TROMETHAMINE 30 MG/ML IJ SOLN
INTRAMUSCULAR | Status: AC
  Filled 2016-11-10: qty 1

## 2016-11-10 MED ORDER — FAMOTIDINE 20 MG PO TABS
ORAL_TABLET | ORAL | Status: AC
  Administered 2016-11-10: 20 mg via ORAL
  Filled 2016-11-10: qty 1

## 2016-11-10 MED ORDER — LACTATED RINGERS IV SOLN
INTRAVENOUS | Status: DC
  Administered 2016-11-10: 08:00:00 via INTRAVENOUS

## 2016-11-10 MED ORDER — PROPOFOL 10 MG/ML IV BOLUS
INTRAVENOUS | Status: DC | PRN
  Administered 2016-11-10: 200 mg via INTRAVENOUS

## 2016-11-10 MED ORDER — ROCURONIUM BROMIDE 50 MG/5ML IV SOLN
INTRAVENOUS | Status: AC
  Filled 2016-11-10: qty 1

## 2016-11-10 MED ORDER — FENTANYL CITRATE (PF) 100 MCG/2ML IJ SOLN
INTRAMUSCULAR | Status: AC
  Filled 2016-11-10: qty 2

## 2016-11-10 MED ORDER — PROPOFOL 10 MG/ML IV BOLUS
INTRAVENOUS | Status: AC
  Filled 2016-11-10: qty 20

## 2016-11-10 MED ORDER — OXYCODONE HCL 5 MG PO TABS: 5.0000 mg | ORAL_TABLET | Freq: Once | ORAL | Status: DC | PRN

## 2016-11-10 MED ORDER — HEPARIN SODIUM (PORCINE) 5000 UNIT/ML IJ SOLN
INTRAMUSCULAR | Status: AC
  Administered 2016-11-10: 5000 [IU] via SUBCUTANEOUS
  Filled 2016-11-10: qty 1

## 2016-11-10 MED ORDER — FAMOTIDINE 20 MG PO TABS
20.0000 mg | ORAL_TABLET | Freq: Once | ORAL | Status: AC
  Administered 2016-11-10: 20 mg via ORAL

## 2016-11-10 MED ORDER — EPHEDRINE SULFATE 50 MG/ML IJ SOLN
INTRAMUSCULAR | Status: AC
  Filled 2016-11-10: qty 1

## 2016-11-10 MED ORDER — ONDANSETRON HCL 4 MG/2ML IJ SOLN
INTRAMUSCULAR | Status: AC
  Filled 2016-11-10: qty 2

## 2016-11-10 MED ORDER — FENTANYL CITRATE (PF) 100 MCG/2ML IJ SOLN
25.0000 ug | INTRAMUSCULAR | Status: DC | PRN
  Administered 2016-11-10: 25 ug via INTRAVENOUS

## 2016-11-10 MED ORDER — LIDOCAINE HCL (PF) 2 % IJ SOLN
INTRAMUSCULAR | Status: AC
  Filled 2016-11-10: qty 10

## 2016-11-10 MED ORDER — SUGAMMADEX SODIUM 200 MG/2ML IV SOLN
INTRAVENOUS | Status: AC
  Filled 2016-11-10: qty 2

## 2016-11-10 MED ORDER — EPHEDRINE SULFATE 50 MG/ML IJ SOLN
INTRAMUSCULAR | Status: DC | PRN
  Administered 2016-11-10: 10 mg via INTRAVENOUS

## 2016-11-10 MED ORDER — BUPIVACAINE-EPINEPHRINE (PF) 0.25% -1:200000 IJ SOLN
INTRAMUSCULAR | Status: AC
  Filled 2016-11-10: qty 30

## 2016-11-10 MED ORDER — MEPERIDINE HCL 50 MG/ML IJ SOLN: 6.2500 mg | INTRAMUSCULAR | Status: DC | PRN

## 2016-11-10 MED ORDER — BUPIVACAINE-EPINEPHRINE (PF) 0.5% -1:200000 IJ SOLN
INTRAMUSCULAR | Status: AC
  Filled 2016-11-10: qty 30

## 2016-11-10 MED ORDER — LIDOCAINE HCL (CARDIAC) 20 MG/ML IV SOLN
INTRAVENOUS | Status: DC | PRN
  Administered 2016-11-10: 100 mg via INTRAVENOUS

## 2016-11-10 MED ORDER — DEXAMETHASONE SODIUM PHOSPHATE 10 MG/ML IJ SOLN
INTRAMUSCULAR | Status: DC | PRN
  Administered 2016-11-10: 10 mg via INTRAVENOUS

## 2016-11-10 MED ORDER — HYDROCODONE-ACETAMINOPHEN 5-300 MG PO TABS: 1.0000 | ORAL_TABLET | ORAL | 0 refills | Status: DC | PRN

## 2016-11-10 MED ORDER — OXYCODONE HCL 5 MG/5ML PO SOLN: 5.0000 mg | Freq: Once | ORAL | Status: DC | PRN

## 2016-11-10 MED ORDER — DEXAMETHASONE SODIUM PHOSPHATE 10 MG/ML IJ SOLN
INTRAMUSCULAR | Status: AC
  Filled 2016-11-10: qty 1

## 2016-11-10 MED ORDER — FENTANYL CITRATE (PF) 100 MCG/2ML IJ SOLN
INTRAMUSCULAR | Status: DC | PRN
  Administered 2016-11-10 (×4): 50 ug via INTRAVENOUS

## 2016-11-10 MED ORDER — SODIUM CHLORIDE FLUSH 0.9 % IV SOLN
INTRAVENOUS | Status: AC
  Filled 2016-11-10: qty 10

## 2016-11-10 SURGICAL SUPPLY — 43 items
ADHESIVE MASTISOL STRL (MISCELLANEOUS) ×3 IMPLANT
APPLIER CLIP ROT 10 11.4 M/L (STAPLE) ×3
BLADE SURG SZ11 CARB STEEL (BLADE) ×3 IMPLANT
CANISTER SUCT 1200ML W/VALVE (MISCELLANEOUS) ×3 IMPLANT
CATH CHOLANGI 4FR 420404F (CATHETERS) IMPLANT
CHLORAPREP W/TINT 26ML (MISCELLANEOUS) ×3 IMPLANT
CLIP APPLIE ROT 10 11.4 M/L (STAPLE) ×1 IMPLANT
CLOSURE WOUND 1/2 X4 (GAUZE/BANDAGES/DRESSINGS) ×1
CONRAY 60ML FOR OR (MISCELLANEOUS) IMPLANT
DRAPE C-ARM XRAY 36X54 (DRAPES) IMPLANT
ELECT REM PT RETURN 9FT ADLT (ELECTROSURGICAL) ×3
ELECTRODE REM PT RTRN 9FT ADLT (ELECTROSURGICAL) ×1 IMPLANT
GAUZE SPONGE NON-WVN 2X2 STRL (MISCELLANEOUS) ×4 IMPLANT
GLOVE BIO SURGEON STRL SZ8 (GLOVE) ×3 IMPLANT
GOWN STRL REUS W/ TWL LRG LVL3 (GOWN DISPOSABLE) ×4 IMPLANT
GOWN STRL REUS W/TWL LRG LVL3 (GOWN DISPOSABLE) ×8
IRRIGATION STRYKERFLOW (MISCELLANEOUS) ×1 IMPLANT
IRRIGATOR STRYKERFLOW (MISCELLANEOUS) ×3
IV CATH ANGIO 12GX3 LT BLUE (NEEDLE) ×3 IMPLANT
IV NS 1000ML (IV SOLUTION) ×2
IV NS 1000ML BAXH (IV SOLUTION) ×1 IMPLANT
JACKSON PRATT 10 (INSTRUMENTS) IMPLANT
KIT RM TURNOVER STRD PROC AR (KITS) ×3 IMPLANT
LABEL OR SOLS (LABEL) ×3 IMPLANT
NDL SAFETY 22GX1.5 (NEEDLE) ×3 IMPLANT
NEEDLE VERESS 14GA 120MM (NEEDLE) ×3 IMPLANT
NS IRRIG 500ML POUR BTL (IV SOLUTION) ×3 IMPLANT
PACK LAP CHOLECYSTECTOMY (MISCELLANEOUS) ×3 IMPLANT
POUCH SPECIMEN RETRIEVAL 10MM (ENDOMECHANICALS) ×3 IMPLANT
SCISSORS METZENBAUM CVD 33 (INSTRUMENTS) ×3 IMPLANT
SLEEVE ENDOPATH XCEL 5M (ENDOMECHANICALS) ×6 IMPLANT
SPONGE LAP 18X18 5 PK (GAUZE/BANDAGES/DRESSINGS) ×3 IMPLANT
SPONGE VERSALON 2X2 STRL (MISCELLANEOUS) ×8
SPONGE VERSALON 4X4 4PLY (MISCELLANEOUS) IMPLANT
STRIP CLOSURE SKIN 1/2X4 (GAUZE/BANDAGES/DRESSINGS) ×2 IMPLANT
SUT MNCRL 4-0 (SUTURE) ×2
SUT MNCRL 4-0 27XMFL (SUTURE) ×1
SUT VICRYL 0 AB UR-6 (SUTURE) ×3 IMPLANT
SUTURE MNCRL 4-0 27XMF (SUTURE) ×1 IMPLANT
SYR 20CC LL (SYRINGE) ×3 IMPLANT
TROCAR XCEL NON-BLD 11X100MML (ENDOMECHANICALS) ×3 IMPLANT
TROCAR XCEL NON-BLD 5MMX100MML (ENDOMECHANICALS) ×3 IMPLANT
TUBING INSUFFLATOR HI FLOW (MISCELLANEOUS) ×3 IMPLANT

## 2016-11-10 NOTE — Anesthesia Procedure Notes (Signed)
Procedure Name: Intubation Date/Time: 11/10/2016 9:39 AM Performed by: Darlyne Russian, CRNA Pre-anesthesia Checklist: Patient identified, Emergency Drugs available, Suction available, Patient being monitored and Timeout performed Patient Re-evaluated:Patient Re-evaluated prior to induction Oxygen Delivery Method: Circle system utilized Preoxygenation: Pre-oxygenation with 100% oxygen Induction Type: IV induction Ventilation: Mask ventilation without difficulty Laryngoscope Size: Mac and 3 Grade View: Grade I Tube type: Oral Tube size: 7.0 mm Number of attempts: 1 Airway Equipment and Method: Stylet Placement Confirmation: ETT inserted through vocal cords under direct vision,  positive ETCO2 and breath sounds checked- equal and bilateral Secured at: 22 cm Tube secured with: Tape Dental Injury: Teeth and Oropharynx as per pre-operative assessment

## 2016-11-10 NOTE — Anesthesia Preprocedure Evaluation (Signed)
Anesthesia Evaluation  Patient identified by MRN, date of birth, ID band Patient awake    Reviewed: Allergy & Precautions, NPO status , Patient's Chart, lab work & pertinent test results  History of Anesthesia Complications Negative for: history of anesthetic complications  Airway Mallampati: III  TM Distance: >3 FB Neck ROM: Full    Dental no notable dental hx.    Pulmonary neg sleep apnea, neg COPD, Current Smoker,    breath sounds clear to auscultation- rhonchi (-) wheezing      Cardiovascular Exercise Tolerance: Good (-) hypertension(-) CAD and (-) Past MI  Rhythm:Regular Rate:Normal - Systolic murmurs and - Diastolic murmurs    Neuro/Psych negative neurological ROS  negative psych ROS   GI/Hepatic negative GI ROS, Neg liver ROS,   Endo/Other  negative endocrine ROSneg diabetes  Renal/GU negative Renal ROS     Musculoskeletal negative musculoskeletal ROS (+)   Abdominal (+) + obese,   Peds  Hematology negative hematology ROS (+)   Anesthesia Other Findings Past Medical History: 10/11/2013: Cholelithiasis 10/11/2013: Obesity affecting pregnancy     Comment:  Overview:  Pt on 81 mg ASA for PIH risk ( MFM) 15 #               weight gain recommended  11/23/2013: Supervision of normal pregnancy     Comment:  Overview:   Factors complicating this pregnancy                 Obesity  HGBA1C  10/23/13   5.4 Plan for twice weekly NST              BMI 42.   Cholelithiasis  New onset HTN: BP 140/80,               144/88 on repeat. Yesterday. BP 160/90 in the office. PIH              labs normal 3/18. Monitored on L&D for 6 hours. BP               105-126/63-78 making me think that 160/90 was erroneous               value. Ms. Bok's arm is large enough such that our               cuff is borderl   Reproductive/Obstetrics                             Anesthesia Physical Anesthesia  Plan  ASA: II  Anesthesia Plan: General   Post-op Pain Management:    Induction: Intravenous  PONV Risk Score and Plan: 2 and Dexamethasone, Ondansetron and Midazolam  Airway Management Planned: Oral ETT  Additional Equipment:   Intra-op Plan:   Post-operative Plan: Extubation in OR  Informed Consent: I have reviewed the patients History and Physical, chart, labs and discussed the procedure including the risks, benefits and alternatives for the proposed anesthesia with the patient or authorized representative who has indicated his/her understanding and acceptance.   Dental advisory given  Plan Discussed with: CRNA and Anesthesiologist  Anesthesia Plan Comments:         Anesthesia Quick Evaluation

## 2016-11-10 NOTE — Transfer of Care (Signed)
Immediate Anesthesia Transfer of Care Note  Patient: Melynda Kellereresa G Castro  Procedure(s) Performed: LAPAROSCOPIC CHOLECYSTECTOMY (N/A )  Patient Location: PACU  Anesthesia Type:General  Level of Consciousness: drowsy and patient cooperative  Airway & Oxygen Therapy: Patient Spontanous Breathing and Patient connected to face mask oxygen  Post-op Assessment: Report given to RN and Post -op Vital signs reviewed and stable  Post vital signs: Reviewed and stable  Last Vitals:  Vitals:   11/10/16 0822 11/10/16 1034  BP: 116/66 135/80  Pulse: 81 96  Resp: 19 20  Temp: 36.8 C 36.9 C  SpO2: 99% 100%    Last Pain:  Vitals:   11/10/16 1034  TempSrc: Temporal  PainSc: 0-No pain         Complications: No apparent anesthesia complications

## 2016-11-10 NOTE — Anesthesia Postprocedure Evaluation (Signed)
Anesthesia Post Note  Patient: Alicia Weaver  Procedure(s) Performed: LAPAROSCOPIC CHOLECYSTECTOMY (N/A )  Patient location during evaluation: PACU Anesthesia Type: General Level of consciousness: awake and alert and oriented Pain management: pain level controlled Vital Signs Assessment: post-procedure vital signs reviewed and stable Respiratory status: spontaneous breathing, nonlabored ventilation and respiratory function stable Cardiovascular status: blood pressure returned to baseline and stable Postop Assessment: no signs of nausea or vomiting Anesthetic complications: no     Last Vitals:  Vitals:   11/10/16 1049 11/10/16 1104  BP: 126/73 (!) 101/59  Pulse: 83 71  Resp: 17 19  Temp:    SpO2: 97% 96%    Last Pain:  Vitals:   11/10/16 1111  TempSrc:   PainSc: Asleep                 Keilyn Haggard

## 2016-11-10 NOTE — Op Note (Signed)
Laparoscopic Cholecystectomy  Pre-operative Diagnosis: Biliary colic  Post-operative Diagnosis: Biliary colic  Procedure: Laparoscopic cholecystectomy  Surgeon: Adah Salvageichard E. Excell Seltzerooper, MD FACS  Anesthesia: Gen. with endotracheal tube  Assistant: PA student  Procedure Details  The patient was seen again in the Holding Room. The benefits, complications, treatment options, and expected outcomes were discussed with the patient. The risks of bleeding, infection, recurrence of symptoms, failure to resolve symptoms, bile duct damage, bile duct leak, retained common bile duct stone, bowel injury, any of which could require further surgery and/or ERCP, stent, or papillotomy were reviewed with the patient. The likelihood of improving the patient's symptoms with return to their baseline status is good.  The patient and/or family concurred with the proposed plan, giving informed consent.  The patient was taken to Operating Room, identified as Alicia Weaver and the procedure verified as Laparoscopic Cholecystectomy.  A Time Out was held and the above information confirmed.  Prior to the induction of general anesthesia, antibiotic prophylaxis was administered. VTE prophylaxis was in place. General endotracheal anesthesia was then administered and tolerated well. After the induction, the abdomen was prepped with Chloraprep and draped in the sterile fashion. The patient was positioned in the supine position.  Local anesthetic  was injected into the skin near the umbilicus and an incision made. The Veress needle was placed. Pneumoperitoneum was then created with CO2 and tolerated well without any adverse changes in the patient's vital signs. A 5mm port was placed in the periumbilical position and the abdominal cavity was explored.  Two 5-mm ports were placed in the right upper quadrant and a 12 mm epigastric port was placed all under direct vision. All skin incisions  were infiltrated with a local anesthetic agent  before making the incision and placing the trocars.   The patient was positioned  in reverse Trendelenburg, tilted slightly to the patient's left.  The gallbladder was identified, the fundus grasped and retracted cephalad. Adhesions were lysed bluntly. The infundibulum was grasped and retracted laterally, exposing the peritoneum overlying the triangle of Calot. This was then divided and exposed in a blunt fashion. A critical view of the cystic duct and cystic artery was obtained.  The cystic duct was clearly identified and bluntly dissected.   The cystic artery was doubly clipped and divided. This allowed for good visualization of the cystic duct as it entered the infundibulum of the gallbladder. Here it was doubly clipped and divided as well.  The gallbladder was taken from the gallbladder fossa in a retrograde fashion with the electrocautery. The gallbladder was removed and placed in an Endocatch bag. The liver bed was irrigated and inspected. Hemostasis was achieved with the electrocautery. Copious irrigation was utilized and was repeatedly aspirated until clear.  The gallbladder and Endocatch sac were then removed through the epigastric port site.   Inspection of the right upper quadrant was performed. No bleeding, bile duct injury or leak, or bowel injury was noted. Pneumoperitoneum was released.  The epigastric port site was closed with figure-of-eight 0 Vicryl sutures. 4-0 subcuticular Monocryl was used to close the skin. Steristrips and Mastisol and sterile dressings were  applied.  The patient was then extubated and brought to the recovery room in stable condition. Sponge, lap, and needle counts were correct at closure and at the conclusion of the case.   Findings: Chronic Cholecystitis   Estimated Blood Loss: Minimal         Drains: None         Specimens: Gallbladder  Complications: none               Alicia Weaver [name], MD, FACS

## 2016-11-10 NOTE — Discharge Instructions (Signed)
Remove dressing in 24 hours. °May shower in 24 hours. °Leave paper strips in place. °Resume all home medications. °Follow-up with Dr. Deionte Spivack in 10 days. °

## 2016-11-10 NOTE — Anesthesia Post-op Follow-up Note (Signed)
Anesthesia QCDR form completed.        

## 2016-11-10 NOTE — Progress Notes (Signed)
Preoperative Review   Patient is met in the preoperative holding area. The history is reviewed in the chart and with the patient. I personally reviewed the options and rationale as well as the risks of this procedure that have been previously discussed with the patient. All questions asked by the patient and/or family were answered to their satisfaction.  Patient agrees to proceed with this procedure at this time.  Cortnee Steinmiller E Quinlynn Cuthbert M.D. FACS  

## 2016-11-11 ENCOUNTER — Telehealth: Payer: Self-pay | Admitting: Surgery

## 2016-11-11 LAB — SURGICAL PATHOLOGY

## 2016-11-11 NOTE — Telephone Encounter (Signed)
Pt states that pain medicine makes her dizzy and not feel well. She has a 22 year old at home and wants something different called in. Please advise.

## 2016-11-11 NOTE — Telephone Encounter (Signed)
Returned patient;s call at this time, however left message for her to return call.  Please let patient know we can try Ibuprofen or Motrin for her pain, or per Dr.Pabon she can try Tramadol.  If she prefers tramadol she will need to come by office and pick up written prescription.

## 2016-11-12 ENCOUNTER — Other Ambulatory Visit: Payer: Self-pay

## 2016-11-12 MED ORDER — TRAMADOL HCL 50 MG PO TABS: 50.0000 mg | ORAL_TABLET | Freq: Four times a day (QID) | ORAL | 0 refills | Status: DC | PRN

## 2016-11-12 NOTE — Telephone Encounter (Signed)
May write pt w tramadol prescription.50 mg po q 6 hrs prn pain  # 20 pills

## 2016-11-12 NOTE — Telephone Encounter (Signed)
Patient has called back and would like to pick up the prescription suggested by Dr Cherie OuchPabon--Tramadol. She will pick this up tomorrow between 8-12:00. Patient was advised that we do close on Friday's at 12:00pm.

## 2016-11-13 NOTE — Telephone Encounter (Signed)
Patient notified of prescription ready to pick up. Patient stated she would come at 2 pm today.

## 2016-11-19 ENCOUNTER — Encounter: Payer: Self-pay | Admitting: Surgery

## 2016-11-19 ENCOUNTER — Ambulatory Visit (INDEPENDENT_AMBULATORY_CARE_PROVIDER_SITE_OTHER): Payer: BLUE CROSS/BLUE SHIELD | Admitting: Surgery

## 2016-11-19 VITALS — BP 111/81 | HR 72 | Temp 98.6°F | Ht 63.0 in | Wt 227.0 lb

## 2016-11-19 DIAGNOSIS — K8020 Calculus of gallbladder without cholecystitis without obstruction: Secondary | ICD-10-CM

## 2016-11-19 NOTE — Progress Notes (Signed)
Outpatient postop visit  11/19/2016  Melynda Kellereresa G Hutto is an 21 y.o. female.    Procedure: Lap chole  CC: No problems  HPI: Patient status post laparoscopic cholecystectomy.  Patient is not taking any narcotics at this time she is back to her normal activities and is eating well no loose stools  Medications reviewed.    Physical Exam:  BP 111/81   Pulse 72   Temp 98.6 F (37 C) (Oral)   Ht 5\' 3"  (1.6 m)   Wt 227 lb (103 kg)   BMI 40.21 kg/m     PE: No icterus no jaundice Marcelo BaldyMorley obese Abdomen is soft nontender wounds are clean without erythema or drainage    Assessment/Plan:  Status post laparoscopic cholecystectomy.  Pathology is confirmed and no sign of malignancy. Patient doing well at this point recommend follow-up on an as-needed basis.  Lattie Hawichard E Shandi Godfrey, MD, FACS

## 2016-11-19 NOTE — Patient Instructions (Signed)
GENERAL POST-OPERATIVE PATIENT INSTRUCTIONS   WOUND CARE INSTRUCTIONS:  Keep a dry clean dressing on the wound if there is drainage. The initial bandage may be removed after 24 hours.  Once the wound has quit draining you may leave it open to air.  If clothing rubs against the wound or causes irritation and the wound is not draining you may cover it with a dry dressing during the daytime.  Try to keep the wound dry and avoid ointments on the wound unless directed to do so.  If the wound becomes bright red and painful or starts to drain infected material that is not clear, please contact your physician immediately.  If the wound is mildly pink and has a thick firm ridge underneath it, this is normal, and is referred to as a healing ridge.  This will resolve over the next 4-6 weeks.  BATHING: You may shower if you have been informed of this by your surgeon. However, Please do not submerge in a tub, hot tub, or pool until incisions are completely sealed or have been told by your surgeon that you may do so.  DIET:  You may eat any foods that you can tolerate.  It is a good idea to eat a high fiber diet and take in plenty of fluids to prevent constipation.  If you do become constipated you may want to take a mild laxative or take ducolax tablets on a daily basis until your bowel habits are regular.  Constipation can be very uncomfortable, along with straining, after recent surgery.  ACTIVITY:  You are encouraged to cough and deep breath or use your incentive spirometer if you were given one, every 15-30 minutes when awake.  This will help prevent respiratory complications and low grade fevers post-operatively if you had a general anesthetic.  You may want to hug a pillow when coughing and sneezing to add additional support to the surgical area, if you had abdominal or chest surgery, which will decrease pain during these times.  You are encouraged to walk and engage in light activity for the next two weeks.  You  should not lift more than 20 pounds, until 12/22/2016 as it could put you at increased risk for complications.  Twenty pounds is roughly equivalent to a plastic bag of groceries. At that time- Listen to your body when lifting, if you have pain when lifting, stop and then try again in a few days. Soreness after doing exercises or activities of daily living is normal as you get back in to your normal routine.  MEDICATIONS:  Try to take narcotic medications and anti-inflammatory medications, such as tylenol, ibuprofen, naprosyn, etc., with food.  This will minimize stomach upset from the medication.  Should you develop nausea and vomiting from the pain medication, or develop a rash, please discontinue the medication and contact your physician.  You should not drive, make important decisions, or operate machinery when taking narcotic pain medication.  SUNBLOCK Use sun block to incision area over the next year if this area will be exposed to sun. This helps decrease scarring and will allow you avoid a permanent darkened area over your incision.  QUESTIONS:  Please feel free to call our office if you have any questions, and we will be glad to assist you. [phone]    

## 2016-11-20 ENCOUNTER — Encounter: Payer: BLUE CROSS/BLUE SHIELD | Admitting: Surgery

## 2017-02-18 ENCOUNTER — Other Ambulatory Visit: Payer: Self-pay

## 2017-02-18 ENCOUNTER — Ambulatory Visit
Admission: EM | Admit: 2017-02-18 | Discharge: 2017-02-18 | Disposition: A | Discharge status: Home / Self Care | Payer: BLUE CROSS/BLUE SHIELD | Attending: Family Medicine | Admitting: Family Medicine

## 2017-02-18 ENCOUNTER — Encounter: Payer: Self-pay | Admitting: *Deleted

## 2017-02-18 DIAGNOSIS — J029 Acute pharyngitis, unspecified: Secondary | ICD-10-CM | POA: Diagnosis not present

## 2017-02-18 DIAGNOSIS — R42 Dizziness and giddiness: Secondary | ICD-10-CM

## 2017-02-18 DIAGNOSIS — B349 Viral infection, unspecified: Secondary | ICD-10-CM

## 2017-02-18 DIAGNOSIS — R110 Nausea: Secondary | ICD-10-CM | POA: Diagnosis not present

## 2017-02-18 LAB — RAPID STREP SCREEN (MED CTR MEBANE ONLY): STREPTOCOCCUS, GROUP A SCREEN (DIRECT): NEGATIVE

## 2017-02-18 MED ORDER — ONDANSETRON 4 MG PO TBDP: 4.0000 mg | ORAL_TABLET | Freq: Three times a day (TID) | ORAL | 0 refills | Status: DC | PRN

## 2017-02-18 NOTE — ED Triage Notes (Signed)
Patient started having symptoms of nausea, vomiting, and dizziness 4 days ago.

## 2017-02-18 NOTE — Discharge Instructions (Signed)
Rest.  Zofran for nausea. Lots of fluids.  If you continue to run fever, please be seen. Right now does not appear to be influenza.  Take care  Dr. Adriana Simasook

## 2017-02-18 NOTE — ED Provider Notes (Signed)
[facility]    CSN: [id number] Arrival date & time: 02/18/17  1317  History   Chief Complaint Chief Complaint  Patient presents with  . Nausea  . Dizziness   HPI  22 year old female presents with nausea, dizziness, and sore throat.  Patient reports that she has been dizzy and has had sore throat since Monday.  She states that her sore throat is mild.  Today, she developed nausea and fever.  She states that she had a 102 degree fever.  She states that this is the primary reason that she came in.  She states that she does not feel well but does not feel overly sick.  She took Tylenol today with improvement in her fever.  She states that she was unable to work today.  No known exacerbating factors.  No known inciting factor.  No other associated symptoms.  No other complaints at this time.  Past Medical History:  Diagnosis Date  . Cholelithiasis 10/11/2013  . Obesity affecting pregnancy 10/11/2013   Overview:  Pt on 81 mg ASA for PIH risk ( MFM) 15 # weight gain recommended   . Supervision of normal pregnancy 11/23/2013   Overview:   Factors complicating this pregnancy   Obesity  HGBA1C  10/23/13   5.4 Plan for twice weekly NST BMI 42.   Cholelithiasis  New onset HTN: BP 140/80, 144/88 on repeat. Yesterday. BP 160/90 in the office. PIH labs normal 3/18. Monitored on L&D for 6 hours. BP 105-126/63-78 making me think that 160/90 was erroneous value. Ms. Exantus's arm is large enough such that our cuff is borderl   Patient Active Problem List   Diagnosis Date Noted  . Biliary colic   . Supervision of normal pregnancy 11/23/2013  . Cholelithiasis 10/11/2013  . Obesity affecting pregnancy 10/11/2013    Past Surgical History:  Procedure Laterality Date  . CHOLECYSTECTOMY N/A 11/10/2016   Procedure: LAPAROSCOPIC CHOLECYSTECTOMY;  Surgeon: Lattie Hawooper, Richard E, MD;  Location: [facility] ORS;  Service: General;  Laterality: N/A;   OB History    No data available     Home  Medications    Prior to Admission medications   Medication Sig Start Date End Date Taking? Authorizing Provider  etonogestrel (NEXPLANON) 68 MG IMPL implant 1 each by Subdermal route once.   Yes [provider]  ondansetron (ZOFRAN-ODT) 4 MG disintegrating tablet Take 1 tablet (4 mg total) by mouth every 8 (eight) hours as needed for nausea or vomiting. 02/18/17   Tommie Samsook, Jakaden Ouzts G, DO   Family History Family History  Problem Relation Age of Onset  . Heart disease Mother   . Cancer Neg Hx    Social History Social History   Tobacco Use  . Smoking status: Current Some Day Smoker    Packs/day: 1.00    Years: 1.00    Pack years: 1.00    Types: Cigarettes  . Smokeless tobacco: Never Used  . Tobacco comment: 1 PACK WILL LAST HER A WEEK  Substance Use Topics  . Alcohol use: No  . Drug use: No    Allergies   Patient has no known allergies.   Review of Systems Review of Systems  Constitutional: Positive for fever.  HENT: Positive for sore throat.   Gastrointestinal: Positive for nausea. Negative for vomiting.  Neurological: Positive for dizziness.   Physical Exam Triage Vital Signs ED Triage Vitals  Enc Vitals Group     BP 02/18/17 1333 (!) 121/55     Pulse  Rate 02/18/17 1333 79     Resp 02/18/17 1333 16     Temp 02/18/17 1333 98.2 F (36.8 C)     Temp Source 02/18/17 1333 Oral     SpO2 02/18/17 1333 97 %     Weight 02/18/17 1334 250 lb (113.4 kg)     Height 02/18/17 1334 5\' 6"  (1.676 m)     Head Circumference --      Peak Flow --      Pain Score 02/18/17 1334 0     Pain Loc --      Pain Edu? --      Excl. in GC? --    Updated Vital Signs BP (!) 121/55 (BP Location: Left Arm)   Pulse 79   Temp 98.2 F (36.8 C) (Oral)   Resp 16   Ht 5\' 6"  (1.676 m)   Wt 250 lb (113.4 kg)   SpO2 97%   BMI 40.35 kg/m   Physical Exam  Constitutional: She is oriented to person, place, and time. She appears well-developed. No distress.  HENT:  Mild oropharyngeal  erythema.  Normal TM's.  Cardiovascular: Normal rate and regular rhythm.  Pulmonary/Chest: Effort normal and breath sounds normal. She has no wheezes. She has no rales.  Neurological: She is alert and oriented to person, place, and time.  Psychiatric: She has a normal mood and affect. Her behavior is normal.  Nursing note and vitals reviewed.  UC Treatments / Results  Labs (all labs ordered are listed, but only abnormal results are displayed) Labs Reviewed  RAPID STREP SCREEN (NOT AT [facility])  CULTURE, GROUP A STREP [facility])    EKG  EKG Interpretation None       Radiology No results found.  Procedures Procedures (including critical care time)  Medications Ordered in UC Medications - No data to display   Initial Impression / Assessment and Plan / UC Course  I have reviewed the triage vital signs and the nursing notes.  Pertinent labs & imaging results that were available during my care of the patient were reviewed by me and considered in my medical decision making (see chart for details).    22 year old female presents with what appears to be a viral illness.  Strep negative.  Does not appear to have influenza.  Well-appearing today and is afebrile.  Supportive care with over the counter Tylenol and Motrin as needed.  Zofran as needed for nausea.  Push fluids.  Final Clinical Impressions(s) / UC Diagnoses   Final diagnoses:  Viral illness    ED Discharge Orders        Ordered    ondansetron (ZOFRAN-ODT) 4 MG disintegrating tablet  Every 8 hours PRN     02/18/17 1425     Controlled Substance Prescriptions [place] Controlled Substance Registry consulted? Not Applicable   Tommie Sams, DO 02/18/17 1430

## 2017-02-20 LAB — CULTURE, GROUP A STREP (THRC)

## 2017-02-25 ENCOUNTER — Telehealth: Payer: Self-pay

## 2017-02-25 MED ORDER — PENICILLIN V POTASSIUM 500 MG PO TABS: 500.0000 mg | ORAL_TABLET | Freq: Two times a day (BID) | ORAL | 0 refills | Status: AC

## 2017-02-25 NOTE — Telephone Encounter (Signed)
-----   Message from Chauncy Passyamon E Meza, [place] sent at 02/25/2017  4:46 PM EST -----   ----- Message ----- From: Isa RankinMurray, Laura Wilson, MD Sent: 02/21/2017   8:10 AM To: Tommie SamsJayce G Cook, DO, [facility] Follow Up  Clinical staff, please let patient know that throat culture was positive for non group A Strep germ.  This is a finding of uncertain significance; not the typical 'strep throat' germ.  If the patient is having severe/persistent sore throat, and/or fever >100.5, ok to send rx for penicillin V 500mg  bid x 10d #20 no refills.   Recheck for further evaluation if symptoms are not improving.  LM

## 2017-05-18 LAB — HM PAP SMEAR: HM Pap smear: NEGATIVE

## 2017-10-01 LAB — HM HIV SCREENING LAB: HM HIV Screening: NEGATIVE

## 2017-12-15 ENCOUNTER — Ambulatory Visit
Admission: EM | Admit: 2017-12-15 | Discharge: 2017-12-15 | Disposition: A | Discharge status: Home / Self Care | Payer: BLUE CROSS/BLUE SHIELD | Attending: Family Medicine | Admitting: Family Medicine

## 2017-12-15 ENCOUNTER — Other Ambulatory Visit: Payer: Self-pay

## 2017-12-15 DIAGNOSIS — B001 Herpesviral vesicular dermatitis: Secondary | ICD-10-CM | POA: Diagnosis not present

## 2017-12-15 DIAGNOSIS — K130 Diseases of lips: Secondary | ICD-10-CM | POA: Diagnosis not present

## 2017-12-15 MED ORDER — MUPIROCIN 2 % EX OINT: TOPICAL_OINTMENT | CUTANEOUS | 0 refills | Status: DC

## 2017-12-15 MED ORDER — VALACYCLOVIR HCL 1 G PO TABS
500.0000 mg | ORAL_TABLET | Freq: Two times a day (BID) | ORAL | 0 refills | Status: AC
Start: 2017-12-15 — End: 2017-12-18

## 2017-12-15 MED ORDER — CEPHALEXIN 500 MG PO CAPS: 500.0000 mg | ORAL_CAPSULE | Freq: Four times a day (QID) | ORAL | 0 refills | Status: AC

## 2017-12-15 NOTE — ED Provider Notes (Signed)
[facility] ____________________________________________  Time seen: Approximately 1:48 PM  I have reviewed the triage vital signs and the nursing notes.   HISTORY  Chief Complaint Blister   HPI Alicia Weaver is a 22 y.o. female presenting for evaluation of cold sore to upper lip.  States this started 2 weeks ago like a typical cold sore that she usually gets, but reports her upper lip then got more swollen surrounding it.  States that she has since tried multiple over-the-counter cold sore creams, even toothpaste, and reports that the area has improved some but continues with left-sided swelling around the cold sore.  Denies any changes or contacts.  Denies any sores or lesions inside mouth.  Denies any swelling inside mouth, tongue, throat, difficulty swallowing, shortness of breath, chest pain or rash.  States has had cold sores intermittently for several years.  Continues to eat and drink well.  No accompanying fevers.  Area is mildly tender with direct palpation.  Otherwise no pain.  Reports otherwise doing well denies other complaints.  No LMP recorded. Patient has had an implant.denies pregnancy.    Past Medical History:  Diagnosis Date  . Cholelithiasis 10/11/2013  . Obesity affecting pregnancy 10/11/2013   Overview:  Pt on 81 mg ASA for PIH risk ( MFM) 15 # weight gain recommended   . Supervision of normal pregnancy 11/23/2013   Overview:   Factors complicating this pregnancy   Obesity  HGBA1C  10/23/13   5.4 Plan for twice weekly NST BMI 42.   Cholelithiasis  New onset HTN: BP 140/80, 144/88 on repeat. Yesterday. BP 160/90 in the office. PIH labs normal 3/18. Monitored on L&D for 6 hours. BP 105-126/63-78 making me think that 160/90 was erroneous value. Ms. Null's arm is large enough such that our cuff is borderl    Patient Active Problem List   Diagnosis Date Noted  . Biliary colic   . Supervision of normal pregnancy 11/23/2013  . Cholelithiasis  10/11/2013  . Obesity affecting pregnancy 10/11/2013    Past Surgical History:  Procedure Laterality Date  . CHOLECYSTECTOMY N/A 11/10/2016   Procedure: LAPAROSCOPIC CHOLECYSTECTOMY;  Surgeon: Lattie Hawooper, Richard E, MD;  Location: [facility] ORS;  Service: General;  Laterality: N/A;  . NO PAST SURGERIES       No current facility-administered medications for this encounter.   Current Outpatient Medications:  .  etonogestrel (NEXPLANON) 68 MG IMPL implant, 1 each by Subdermal route once., Disp: , Rfl:  .  cephALEXin (KEFLEX) 500 MG capsule, Take 1 capsule (500 mg total) by mouth 4 (four) times daily for 10 days., Disp: 40 capsule, Rfl: 0 .  mupirocin ointment (BACTROBAN) 2 %, Apply two times a day for 7 days., Disp: 22 g, Rfl: 0 .  valACYclovir (VALTREX) 1000 MG tablet, Take 0.5 tablets (500 mg total) by mouth 2 (two) times daily for 3 days., Disp: 3 tablet, Rfl: 0  Allergies Patient has no known allergies.  Family History  Problem Relation Age of Onset  . Heart disease Mother   . Cancer Neg Hx     Social History Social History   Tobacco Use  . Smoking status: Current Some Day Smoker    Packs/day: 1.00    Years: 1.00    Pack years: 1.00    Types: Cigarettes  . Smokeless tobacco: Never Used  . Tobacco comment: 1 PACK WILL LAST HER A WEEK  Substance Use Topics  . Alcohol use: No  . Drug use: No  Review of Systems Constitutional: No fever ENT: No sore throat. As above.  Cardiovascular: Denies chest pain. Respiratory: Denies shortness of breath. Gastrointestinal: No abdominal pain Musculoskeletal: Negative for back pain.  ____________________________________________   PHYSICAL EXAM:  VITAL SIGNS: ED Triage Vitals  Enc Vitals Group     BP 12/15/17 1338 125/72     Pulse Rate 12/15/17 1338 79     Resp 12/15/17 1338 18     Temp 12/15/17 1338 98.6 F (37 C)     Temp Source 12/15/17 1338 Oral     SpO2 12/15/17 1338 100 %     Weight 12/15/17 1336 220 lb (99.8 kg)      Height 12/15/17 1336 5\' 4"  (1.626 m)     Head Circumference --      Peak Flow --      Pain Score 12/15/17 1336 4     Pain Loc --      Pain Edu? --      Excl. in GC? --     Constitutional: Alert and oriented. Well appearing and in no acute distress. Eyes: Conjunctivae are normal. Head: Atraumatic. No sinus tenderness to palpation. No swelling. No erythema.  Ears: no erythema, normal TMs bilaterally.   Nose:No nasal congestion   Mouth/Throat: Mucous membranes are moist. No pharyngeal erythema. No tonsillar swelling or exudate.  No intraoral lesions noted.  No other oropharyngeal edema.             Except: Left upper lip 2 areas of approximately 0.5 cm with mild ulcerative appearance with honey colored crusting with mild localized lip edema, minimal induration, no drainage, no fluctuance, no erythema surrounding, no other lip swelling. Neck: No stridor.  No cervical spine tenderness to palpation. Hematological/Lymphatic/Immunilogical: No cervical lymphadenopathy. Cardiovascular: Normal rate, regular rhythm. Grossly normal heart sounds.  Good peripheral circulation. Respiratory: Normal respiratory effort.  No retractions. No wheezes, rales or rhonchi. Good air movement.  Musculoskeletal: Ambulatory with steady gait.  Neurologic:  Normal speech and language. No gait instability. Skin:  Skin appears warm, dry and intact. No rash noted. Psychiatric: Mood and affect are normal. Speech and behavior are normal.  ___________________________________________   LABS (all labs ordered are listed, but only abnormal results are displayed)  Labs Reviewed - No data to display  PROCEDURES Procedures    INITIAL IMPRESSION / ASSESSMENT AND PLAN / ED COURSE  Pertinent labs & imaging results that were available during my care of the patient were reviewed by me and considered in my medical decision making (see chart for details).  Well-appearing patient.  No acute distress.  Suspect recent cold sore  with secondary infection.  Will treat with oral Keflex, topical Bactroban and acyclovir.  Supportive care, keeping clean and monitoring.Discussed indication, risks and benefits of medications with patient.  Discussed follow up with Primary care physician this week. Discussed follow up and return parameters including no resolution or any worsening concerns. Patient verbalized understanding and agreed to plan.   ____________________________________________   FINAL CLINICAL IMPRESSION(S) / ED DIAGNOSES  Final diagnoses:  Cold sore  Infection of lip     ED Discharge Orders         Ordered    valACYclovir (VALTREX) 1000 MG tablet  2 times daily     12/15/17 1347    cephALEXin (KEFLEX) 500 MG capsule  4 times daily     12/15/17 1347    mupirocin ointment (BACTROBAN) 2 %     12/15/17 1347  Note: This dictation was prepared with Dragon dictation along with smaller phrase technology. Any transcriptional errors that result from this process are unintentional.         Renford Dills, NP 12/15/17 1357

## 2017-12-15 NOTE — Discharge Instructions (Addendum)
Take medication as prescribed. Keep clean.  ° °Follow up with your primary care physician this week as needed. Return to Urgent care for new or worsening concerns.  ° °

## 2017-12-15 NOTE — ED Triage Notes (Signed)
Patient complains of fever blister that started 2 weeks ago, patient states that area was worse and involved entire top lip. Patient states that she has not been able to get this to resolve, reports that she has had fever blisters before in the past but, never last this long.

## 2018-05-27 ENCOUNTER — Other Ambulatory Visit: Payer: Self-pay

## 2018-05-27 ENCOUNTER — Ambulatory Visit
Admission: EM | Admit: 2018-05-27 | Discharge: 2018-05-27 | Disposition: A | Discharge status: Home / Self Care | Payer: Medicaid Other | Attending: Family Medicine | Admitting: Family Medicine

## 2018-05-27 ENCOUNTER — Ambulatory Visit: Payer: Medicaid Other

## 2018-05-27 DIAGNOSIS — S8001XA Contusion of right knee, initial encounter: Secondary | ICD-10-CM | POA: Diagnosis present

## 2018-05-27 DIAGNOSIS — S8391XA Sprain of unspecified site of right knee, initial encounter: Secondary | ICD-10-CM | POA: Insufficient documentation

## 2018-05-27 DIAGNOSIS — W109XXA Fall (on) (from) unspecified stairs and steps, initial encounter: Secondary | ICD-10-CM | POA: Diagnosis not present

## 2018-05-27 NOTE — ED Triage Notes (Signed)
Patient complains of right knee pain after a fall that occurred this morning while at home. States that she was walking down her steps and they were slippery and fell onto her knee.

## 2018-05-27 NOTE — Discharge Instructions (Addendum)
Take over-the-counter Tylenol ibuprofen as needed.  Apply ice and elevate.  Use the sleeve that you have for the next 3 to 4 days.  Gradually increase activity as tolerated.  Follow-up in 1 week with orthopedic as needed for continued pain.  Follow up with your primary care physician this week as needed. Return to Urgent care for new or worsening concerns.

## 2018-05-27 NOTE — ED Provider Notes (Signed)
[facility] ____________________________________________  Time seen: Approximately 9:31 AM  I have reviewed the triage vital signs and the nursing notes.   HISTORY  Chief Complaint Fall and Knee Pain (right)  HPI Alicia Weaver is a 23 y.o. female presenting for evaluation of right knee pain since this morning.  Patient states around 630 this morning she was going outside to go to work, and states she stepped on a wet slippery step causing her to fall.  States that she fell twisting her knee and landed directly on the right knee.  Denies any other pain or injuries.  No head injury or loss of consciousness.  States pain is mild currently, moderate with activity.  Did put a sleeve brace on the knee which helps some, no other alleviating measures taken.  States the knee feels like it pops and possibly could give out.  Has continued to remain ambulatory.  Denies pain radiation, paresthesias, break in skin or other injuries.  No recent cough, fever or sickness.  No LMP recorded. Patient has had an implant.Denies pregnancy.     Past Medical History:  Diagnosis Date  . Cholelithiasis 10/11/2013  . Obesity affecting pregnancy 10/11/2013   Overview:  Pt on 81 mg ASA for PIH risk ( MFM) 15 # weight gain recommended   . Supervision of normal pregnancy 11/23/2013   Overview:   Factors complicating this pregnancy   Obesity  HGBA1C  10/23/13   5.4 Plan for twice weekly NST BMI 42.   Cholelithiasis  New onset HTN: BP 140/80, 144/88 on repeat. Yesterday. BP 160/90 in the office. PIH labs normal 3/18. Monitored on L&D for 6 hours. BP 105-126/63-78 making me think that 160/90 was erroneous value. Ms. Delrosario's arm is large enough such that our cuff is borderl    Patient Active Problem List   Diagnosis Date Noted  . Biliary colic   . Supervision of normal pregnancy 11/23/2013  . Cholelithiasis 10/11/2013  . Obesity affecting pregnancy 10/11/2013    Past Surgical History:   Procedure Laterality Date  . CHOLECYSTECTOMY N/A 11/10/2016   Procedure: LAPAROSCOPIC CHOLECYSTECTOMY;  Surgeon: Lattie Hawooper, Richard E, MD;  Location: [facility] OR[facility];  Service: General;  Laterality: N/A;  . NO PAST SURGERIES       No current facility-administered medications for this encounter.   Current Outpatient Medications:  .  etonogestrel (NEXPLANON) 68 MG IMPL implant, 1 each by Subdermal route once., Disp: , Rfl:  .  mupirocin ointment (BACTROBAN) 2 %, Apply two times a day for 7 days., Disp: 22 g, Rfl: 0  Allergies Patient has no known allergies.  Family History  Problem Relation Age of Onset  . Heart disease Mother   . Cancer Neg Hx     Social History Social History   Tobacco Use  . Smoking status: Current Some Day Smoker    Packs/day: 1.00    Years: 1.00    Pack years: 1.00    Types: Cigarettes  . Smokeless tobacco: Never Used  . Tobacco comment: 1 PACK WILL LAST HER A WEEK  Substance Use Topics  . Alcohol use: No  . Drug use: No    Review of Systems Constitutional: No fever Cardiovascular: Denies chest pain. Respiratory: Denies shortness of breath. Gastrointestinal: No abdominal pain.   Musculoskeletal: Negative for back pain.  Positive right knee pain. Skin: Negative for rash.   ____________________________________________   PHYSICAL EXAM:  VITAL SIGNS: ED Triage Vitals  Enc Vitals Group     BP 05/27/18  0911 130/60     Pulse Rate 05/27/18 0911 81     Resp 05/27/18 0911 16     Temp 05/27/18 0911 98.8 F (37.1 C)     Temp Source 05/27/18 0911 Oral     SpO2 05/27/18 0911 100 %     Weight 05/27/18 0910 220 lb (99.8 kg)     Height 05/27/18 0910  (1.6 m)     Head Circumference --      Peak Flow --      Pain Score 05/27/18 0910 9     Pain Loc --      Pain Edu? --      Excl. in GC? --     Constitutional: Alert and oriented. Well appearing and in no acute distress. ENT      Head: Normocephalic and atraumatic. Cardiovascular: Normal rate,  regular rhythm. Grossly normal heart sounds.  Good peripheral circulation. Respiratory: Normal respiratory effort without tachypnea nor retractions. Breath sounds are clear and equal bilaterally. No wheezes, rales, rhonchi. Musculoskeletal: Steady gait.  Bilateral distal pedal pulses equally palpated. Except: Right anterior knee mild tenderness across patella, mild medial knee tenderness to direct palpation as well as mild tenderness to palpation medial posterior knee, no edema, no ecchymosis, no effusion, mild pain with posterior drawer test, no pain with anterior drawer test, mild pain with medial lateral stress, no laxity noted, full range of motion present, ambulatory with steady gait. Neurologic:  Normal speech and language.  Speech is normal. No gait instability.  Skin:  Skin is warm, dry and intact. No rash noted. Psychiatric: Mood and affect are normal. Speech and behavior are normal. Patient exhibits appropriate insight and judgment   ___________________________________________   LABS (all labs ordered are listed, but only abnormal results are displayed)  Labs Reviewed - No data to display  RADIOLOGY  Dg Knee Complete 4 Views Right  Result Date: 05/27/2018 CLINICAL DATA:  Medial knee pain after fall this morning. EXAM: RIGHT KNEE - COMPLETE 4+ VIEW COMPARISON:  None. FINDINGS: No acute fracture or dislocation. No joint effusion. Joint spaces are preserved. Small medial and lateral marginal compartment osteophytes. Bone mineralization is normal. Soft tissues are unremarkable. IMPRESSION: 1.  No acute osseous abnormality. 2. Early degenerative osteophytosis. Electronically Signed   By: Obie Dredge M.D.   On: 05/27/2018 09:55   ____________________________________________   PROCEDURES Procedures    INITIAL IMPRESSION / ASSESSMENT AND PLAN / ED COURSE  Pertinent labs & imaging results that were available during my care of the patient were reviewed by me and considered in my  medical decision making (see chart for details).  Well-appearing patient.  No acute distress.  Right knee pain post mechanical fall that occurred this morning.  Right knee x-ray as above, no acute osseous abnormality, early degenerative osteophytosis.  Suspect contusion and strain injuries.  Patient has compression sleeve with support in room, continue brace for the next 3 to 4 days.  Offered crutches, patient declined.  Over-the-counter Tylenol ibuprofen, ice and elevation.  Work note given for today.  Gradual increase tolerated activity.  Follow-up with orthopedic in 1 week as needed for continued pain.  Discussed follow up with Primary care physician this week as needed. Discussed follow up and return parameters including no resolution or any worsening concerns. Patient verbalized understanding and agreed to plan.   ____________________________________________   FINAL CLINICAL IMPRESSION(S) / ED DIAGNOSES  Final diagnoses:  Contusion of right knee, initial encounter  Sprain of right knee, unspecified  ligament, initial encounter     ED Discharge Orders    None       Note: This dictation was prepared with Dragon dictation along with smaller phrase technology. Any transcriptional errors that result from this process are unintentional.         Renford Dills, NP 05/27/18 1027

## 2018-05-31 ENCOUNTER — Encounter: Payer: Self-pay | Admitting: Emergency Medicine

## 2018-05-31 ENCOUNTER — Other Ambulatory Visit: Payer: Self-pay

## 2018-05-31 ENCOUNTER — Ambulatory Visit
Admission: EM | Admit: 2018-05-31 | Discharge: 2018-05-31 | Disposition: A | Discharge status: Home / Self Care | Payer: Medicaid Other | Attending: Emergency Medicine | Admitting: Emergency Medicine

## 2018-05-31 DIAGNOSIS — M25561 Pain in right knee: Secondary | ICD-10-CM

## 2018-05-31 MED ORDER — MELOXICAM 15 MG PO TABS: 15.0000 mg | ORAL_TABLET | Freq: Every day | ORAL | 0 refills | Status: DC | PRN

## 2018-05-31 NOTE — ED Triage Notes (Addendum)
Patient states she fell down her steps on 05/22 and was seen here. She states the swelling and pain have not gotten any better. Patient here for a work note because she said she went to work today and they sent her here for a note for restrictions but it can't say "light duty."  Patient also requesting a knee brace as the one she had from her sister was too small.

## 2018-05-31 NOTE — ED Provider Notes (Signed)
[facility] ____________________________________________  Time seen: Approximately 11:43 AM  I have reviewed the triage vital signs and the nursing notes.   HISTORY  Chief Complaint work note and Knee Pain   HPI Alicia Weaver is a 23 y.o. female presenting for reevaluation of right knee pain.  Patient was seen in urgent care 4 days ago for the same complaint after slipping and falling on her right knee.  States she had initially been using her sister's knee brace, but states it seemed too tight for her so she stopped.  States was doing well over the weekend after resting it and it was feeling better.  However states today at work she was fine until she got put in an area where she had to stand the whole time and stated at that point her knee pain increased.  Has continued remain ambulatory.  Denies other injury.  Denies pain radiation, edema or paresthesias.  States pain is mostly to the inside of her knee.  Reports otherwise doing well.  Denies recent sickness.  No LMP recorded. Patient has had an implant.  Denies pregnancy.    Past Medical History:  Diagnosis Date  . Cholelithiasis 10/11/2013  . Obesity affecting pregnancy 10/11/2013   Overview:  Pt on 81 mg ASA for PIH risk ( MFM) 15 # weight gain recommended   . Supervision of normal pregnancy 11/23/2013   Overview:   Factors complicating this pregnancy   Obesity  HGBA1C  10/23/13   5.4 Plan for twice weekly NST BMI 42.   Cholelithiasis  New onset HTN: BP 140/80, 144/88 on repeat. Yesterday. BP 160/90 in the office. PIH labs normal 3/18. Monitored on L&D for 6 hours. BP 105-126/63-78 making me think that 160/90 was erroneous value. Ms. Bekele's arm is large enough such that our cuff is borderl    Patient Active Problem List   Diagnosis Date Noted  . Biliary colic   . Supervision of normal pregnancy 11/23/2013  . Cholelithiasis 10/11/2013  . Obesity affecting pregnancy 10/11/2013    Past Surgical History:   Procedure Laterality Date  . CHOLECYSTECTOMY N/A 11/10/2016   Procedure: LAPAROSCOPIC CHOLECYSTECTOMY;  Surgeon: Lattie Haw, MD;  Location: [facility] OR[facility];  Service: General;  Laterality: N/A;  . NO PAST SURGERIES       No current facility-administered medications for this encounter.   Current Outpatient Medications:  .  etonogestrel (NEXPLANON) 68 MG IMPL implant, 1 each by Subdermal route once., Disp: , Rfl:  .  meloxicam (MOBIC) 15 MG tablet, Take 1 tablet (15 mg total) by mouth daily as needed., Disp: 10 tablet, Rfl: 0  Allergies Patient has no known allergies.  Family History  Problem Relation Age of Onset  . Heart disease Mother   . Cancer Neg Hx     Social History Social History   Tobacco Use  . Smoking status: Current Some Day Smoker    Packs/day: 1.00    Years: 1.00    Pack years: 1.00    Types: Cigarettes  . Smokeless tobacco: Never Used  . Tobacco comment: 1 PACK WILL LAST HER A WEEK  Substance Use Topics  . Alcohol use: No  . Drug use: No    Review of Systems Constitutional: No fever Cardiovascular: Denies chest pain. Respiratory: Denies shortness of breath. Gastrointestinal: No abdominal pain.   Musculoskeletal positive right knee pain. Skin: Negative for rash  ____________________________________________   PHYSICAL EXAM:  VITAL SIGNS: ED Triage Vitals  Enc Vitals Group  BP 05/31/18 1058 136/83     Pulse Rate 05/31/18 1058 72     Resp 05/31/18 1058 18     Temp 05/31/18 1058 98.1 F (36.7 C)     Temp Source 05/31/18 1058 Oral     SpO2 05/31/18 1058 100 %     Weight 05/31/18 1059 220 lb (99.8 kg)     Height 05/31/18 1059 5\' 3"  (1.6 m)     Head Circumference --      Peak Flow --      Pain Score 05/31/18 1058 8     Pain Loc --      Pain Edu? --      Excl. in GC? --     Constitutional: Alert and oriented. Well appearing and in no acute distress. ENT      Head: Normocephalic and atraumatic. Cardiovascular: Normal rate, regular  rhythm. Grossly normal heart sounds.  Good peripheral circulation. Respiratory: Normal respiratory effort without tachypnea nor retractions. Breath sounds are clear and equal bilaterally. No wheezes, rales, rhonchi. Musculoskeletal: Steady gait.  Right distal pedal pulses intact.   Except: Right medial knee mild tenderness to direct palpation, no edema, full range of motion present but with pain with full extension as well as full flexion, mild pain with medial stress, no pain with anterior posterior drawer, right lower extremity otherwise nontender. Neurologic:  Normal speech and language. No gross focal neurologic deficits are appreciated. Speech is normal. No gait instability.  Skin:  Skin is warm, dry and intact. No rash noted. Psychiatric: Mood and affect are normal. Speech and behavior are normal. Patient exhibits appropriate insight and judgment   ___________________________________________   LABS (all labs ordered are listed, but only abnormal results are displayed)  Labs Reviewed - No data to display ____________________________________________  PROCEDURES Procedures     INITIAL IMPRESSION / ASSESSMENT AND PLAN / ED COURSE  Pertinent labs & imaging results that were available during my care of the patient were reviewed by me and considered in my medical decision making (see chart for details).  Well-appearing patient.  No acute distress.  Continue to right knee pain.  Will start patient on daily Mobic.  Right knee x-ray from 5/22 showing per radiologist no acute osseous abnormality.  Suspect contusion and sprain injuries.  Knee brace given today.  Continue to ice and supportive care.  Follow-up with orthopedic.  Work note given.Discussed indication, risks and benefits of medications with patient.  Discussed follow up with Primary care physician this week. Discussed follow up and return parameters including no resolution or any worsening concerns. Patient verbalized understanding  and agreed to plan.   ____________________________________________   FINAL CLINICAL IMPRESSION(S) / ED DIAGNOSES  Final diagnoses:  Acute pain of right knee     ED Discharge Orders         Ordered    meloxicam (MOBIC) 15 MG tablet  Daily PRN     05/31/18 1142           Note: This dictation was prepared with Dragon dictation along with smaller phrase technology. Any transcriptional errors that result from this process are unintentional.         Renford DillsMiller, Trayvion Embleton, NP 05/31/18 1147

## 2018-05-31 NOTE — Discharge Instructions (Addendum)
Take medication as prescribed. Rest. Drink plenty of fluids. Use brace. Ice.   Follow-up with orthopedic as discussed for continued pain.  Follow up with your primary care physician this week as needed. Return to Urgent care for new or worsening concerns.

## 2018-06-03 ENCOUNTER — Ambulatory Visit
Admission: EM | Admit: 2018-06-03 | Discharge: 2018-06-03 | Disposition: A | Discharge status: Home / Self Care | Payer: Medicaid Other | Attending: Family Medicine | Admitting: Family Medicine

## 2018-06-03 ENCOUNTER — Other Ambulatory Visit: Payer: Self-pay

## 2018-06-03 ENCOUNTER — Encounter: Payer: Self-pay | Admitting: Emergency Medicine

## 2018-06-03 DIAGNOSIS — S76211A Strain of adductor muscle, fascia and tendon of right thigh, initial encounter: Secondary | ICD-10-CM

## 2018-06-03 MED ORDER — CYCLOBENZAPRINE HCL 10 MG PO TABS: 10.0000 mg | ORAL_TABLET | Freq: Every day | ORAL | 0 refills | Status: DC
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
## 2018-06-03 NOTE — Discharge Instructions (Signed)
Rest, ice, over the counter anti-inflammatories (advil, motrin, or aleve)
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
## 2018-07-26 ENCOUNTER — Ambulatory Visit
Admission: EM | Admit: 2018-07-26 | Discharge: 2018-07-26 | Disposition: A | Discharge status: Home / Self Care | Payer: Medicaid Other | Attending: Family Medicine | Admitting: Family Medicine

## 2018-07-26 ENCOUNTER — Other Ambulatory Visit: Payer: Self-pay

## 2018-07-26 ENCOUNTER — Encounter: Payer: Self-pay | Admitting: Emergency Medicine

## 2018-07-26 DIAGNOSIS — R42 Dizziness and giddiness: Secondary | ICD-10-CM

## 2018-07-26 MED ORDER — MECLIZINE HCL 25 MG PO TABS: 25.0000 mg | ORAL_TABLET | Freq: Three times a day (TID) | ORAL | 0 refills | Status: DC | PRN

## 2018-07-26 NOTE — Discharge Instructions (Signed)
Medication as prescribed.  Take care  Dr. Aracelis Ulrey  

## 2018-07-26 NOTE — ED Triage Notes (Signed)
Pt c/o dizziness. Started yesterday. She also vomited last night. Made worse with fast movements.

## 2018-07-26 NOTE — ED Provider Notes (Signed)
[facility]    CSN: [id number] Arrival date & time: 07/26/18  1346     History   Chief Complaint Chief Complaint  Patient presents with  . Dizziness   HPI   23 year old female presents with dizziness.  Started yesterday.  Patient reports dizziness.  Feels as if things are spinning/moving.  Worse with abrupt movements.  She reports one episode of emesis.  Seems to be improving today particularly after she slept for a good period of time.  No headache.  No vision changes.  No other associated symptoms.  No other complaints or concerns.  PMH, Surgical Hx, Family Hx, Social History reviewed and updated as below.  Past Medical History:  Diagnosis Date  . Cholelithiasis 10/11/2013  . Obesity affecting pregnancy 10/11/2013   Overview:  Pt on 81 mg ASA for PIH risk ( MFM) 15 # weight gain recommended   . Supervision of normal pregnancy 11/23/2013   Overview:   Factors complicating this pregnancy   Obesity  HGBA1C  10/23/13   5.4 Plan for twice weekly NST BMI 42.   Cholelithiasis  New onset HTN: BP 140/80, 144/88 on repeat. Yesterday. BP 160/90 in the office. [place] labs normal 3/18. Monitored on L&D for 6 hours. BP 105-126/63-78 making me think that 160/90 was erroneous value. Ms. Paskett's arm is large enough such that our cuff is borderl    Patient Active Problem List   Diagnosis Date Noted  . Biliary colic   . Supervision of normal pregnancy 11/23/2013  . Cholelithiasis 10/11/2013  . Obesity affecting pregnancy 10/11/2013    Past Surgical History:  Procedure Laterality Date  . CHOLECYSTECTOMY N/A 11/10/2016   Procedure: LAPAROSCOPIC CHOLECYSTECTOMY;  Surgeon: Florene Glen, MD;  Location: [facility] ORS;  Service: General;  Laterality: N/A;    OB History   No obstetric history on file.      Home Medications    Prior to Admission medications   Medication Sig Start Date End Date Taking? Authorizing Provider  etonogestrel (NEXPLANON) 68 MG IMPL implant 1 each  by Subdermal route once.   Yes [provider]  meclizine (ANTIVERT) 25 MG tablet Take 1 tablet (25 mg total) by mouth 3 (three) times daily as needed for dizziness. 07/26/18   Coral Spikes, DO    Family History Family History  Problem Relation Age of Onset  . Heart disease Mother   . Cancer Neg Hx     Social History Social History   Tobacco Use  . Smoking status: Current Some Day Smoker    Packs/day: 1.00    Years: 1.00    Pack years: 1.00    Types: Cigarettes  . Smokeless tobacco: Never Used  . Tobacco comment: 1 PACK WILL LAST HER A WEEK  Substance Use Topics  . Alcohol use: No  . Drug use: No     Allergies   Patient has no known allergies.   Review of Systems Review of Systems  Gastrointestinal: Positive for vomiting.  Neurological: Positive for dizziness.   Physical Exam Triage Vital Signs ED Triage Vitals [07/26/18 1403]  Enc Vitals Group     BP 111/66     Pulse Rate 74     Resp 18     Temp 98.9 F (37.2 C)     Temp Source Oral     SpO2 100 %     Weight 230 lb (104.3 kg)     Height 5\' 4"  (1.626 m)     Head Circumference  Peak Flow      Pain Score 0     Pain Loc      Pain Edu?      Excl. in GC?    Updated Vital Signs BP 111/66 (BP Location: Left Arm)   Pulse 74   Temp 98.9 F (37.2 C) (Oral)   Resp 18   Ht 5\' 4"  (1.626 m)   Wt 104.3 kg   SpO2 100%   BMI 39.48 kg/m   Visual Acuity Right Eye Distance:   Left Eye Distance:   Bilateral Distance:    Right Eye Near:   Left Eye Near:    Bilateral Near:     Physical Exam Vitals signs and nursing note reviewed.  Constitutional:      General: She is not in acute distress.    Appearance: Normal appearance.  HENT:     Head: Normocephalic and atraumatic.  Eyes:     Extraocular Movements: Extraocular movements intact.     Comments: Nystagmus noted with leftward gaze.  Cardiovascular:     Rate and Rhythm: Normal rate and regular rhythm.  Pulmonary:     Effort: Pulmonary  effort is normal.     Breath sounds: Normal breath sounds.  Neurological:     General: No focal deficit present.     Mental Status: She is alert.  Psychiatric:        Mood and Affect: Mood normal.        Behavior: Behavior normal.     UC Treatments / Results  Labs (all labs ordered are listed, but only abnormal results are displayed) Labs Reviewed - No data to display  EKG   Radiology No results found.  Procedures Procedures (including critical care time)  Medications Ordered in UC Medications - No data to display  Initial Impression / Assessment and Plan / UC Course  I have reviewed the triage vital signs and the nursing notes.  Pertinent labs & imaging results that were available during my care of the patient were reviewed by me and considered in my medical decision making (see chart for details).    23 year old female presents with vertigo.  Treating with meclizine.  Work note given.  Final Clinical Impressions(s) / UC Diagnoses   Final diagnoses:  Vertigo     Discharge Instructions     Medication as prescribed.  Take care  Dr. Adriana Simasook     ED Prescriptions    Medication Sig Dispense Auth. Provider   meclizine (ANTIVERT) 25 MG tablet Take 1 tablet (25 mg total) by mouth 3 (three) times daily as needed for dizziness. 30 tablet Tommie Samsook, Jashua Knaak G, DO     Controlled Substance Prescriptions [place] Controlled Substance Registry consulted? Not Applicable   Tommie SamsCook, Salima Rumer G, DO 07/26/18 1436

## 2018-09-28 ENCOUNTER — Other Ambulatory Visit: Payer: Self-pay | Admitting: *Deleted

## 2018-09-28 DIAGNOSIS — Z20822 Contact with and (suspected) exposure to covid-19: Secondary | ICD-10-CM

## 2018-09-30 ENCOUNTER — Ambulatory Visit
Admission: EM | Admit: 2018-09-30 | Discharge: 2018-09-30 | Disposition: A | Discharge status: Home / Self Care | Payer: Medicaid Other | Attending: Family Medicine | Admitting: Family Medicine

## 2018-09-30 ENCOUNTER — Encounter: Payer: Self-pay | Admitting: Emergency Medicine

## 2018-09-30 ENCOUNTER — Other Ambulatory Visit: Payer: Self-pay

## 2018-09-30 DIAGNOSIS — J309 Allergic rhinitis, unspecified: Secondary | ICD-10-CM | POA: Diagnosis not present

## 2018-09-30 LAB — NOVEL CORONAVIRUS, NAA: SARS-CoV-2, NAA: NOT DETECTED

## 2018-09-30 NOTE — ED Triage Notes (Signed)
Patient states that she needs a note to go back to work.

## 2018-09-30 NOTE — ED Triage Notes (Signed)
Patient c/o sneezing, HAs, runny nose, and watery eyes since Tuesday.  Patient states that she had COVID test on Wed and was Negative.  Patient denies fevers.

## 2018-09-30 NOTE — Discharge Instructions (Signed)
Over the counter steroid nasal spray Over the counter zyrtec and benadryl

## 2018-10-02 NOTE — ED Provider Notes (Signed)
[facility]    CSN: [id number] Arrival date & time: 09/30/18  1632      History   Chief Complaint Chief Complaint  Patient presents with  . Nasal Congestion    HPI Alicia Weaver is a 23 y.o. female.   23 yo female with a c/o sneezing, nasal congestion, runny nose, watery eyes for the past week. Denies any fevers, chills, cough, wheezing. States she was tested for covid 2 days ago and was negative.      Past Medical History:  Diagnosis Date  . Cholelithiasis 10/11/2013  . Obesity affecting pregnancy 10/11/2013   Overview:  Pt on 81 mg ASA for PIH risk ( MFM) 15 # weight gain recommended   . Supervision of normal pregnancy 11/23/2013   Overview:   Factors complicating this pregnancy   Obesity  HGBA1C  10/23/13   5.4 Plan for twice weekly NST BMI 42.   Cholelithiasis  New onset HTN: BP 140/80, 144/88 on repeat. Yesterday. BP 160/90 in the office. [place] labs normal 3/18. Monitored on L&D for 6 hours. BP 105-126/63-78 making me think that 160/90 was erroneous value. Ms. Uzzle's arm is large enough such that our cuff is borderl    Patient Active Problem List   Diagnosis Date Noted  . Biliary colic   . Supervision of normal pregnancy 11/23/2013  . Cholelithiasis 10/11/2013  . Obesity affecting pregnancy 10/11/2013    Past Surgical History:  Procedure Laterality Date  . CHOLECYSTECTOMY N/A 11/10/2016   Procedure: LAPAROSCOPIC CHOLECYSTECTOMY;  Surgeon: Florene Glen, MD;  Location: [facility] ORS;  Service: General;  Laterality: N/A;  . NO PAST SURGERIES      OB History   No obstetric history on file.      Home Medications    Prior to Admission medications   Medication Sig Start Date End Date Taking? Authorizing Provider  etonogestrel (NEXPLANON) 68 MG IMPL implant 1 each by Subdermal route once.   Yes [provider]  meclizine (ANTIVERT) 25 MG tablet Take 1 tablet (25 mg total) by mouth 3 (three) times daily as needed for dizziness.  07/26/18   Coral Spikes, DO    Family History Family History  Problem Relation Age of Onset  . Heart disease Mother   . Cancer Neg Hx     Social History Social History   Tobacco Use  . Smoking status: Current Some Day Smoker    Packs/day: 1.00    Years: 1.00    Pack years: 1.00    Types: Cigarettes  . Smokeless tobacco: Never Used  . Tobacco comment: 1 PACK WILL LAST HER A WEEK  Substance Use Topics  . Alcohol use: No  . Drug use: No     Allergies   Patient has no known allergies.   Review of Systems Review of Systems   Physical Exam Triage Vital Signs ED Triage Vitals  Enc Vitals Group     BP 09/30/18 1656 121/78     Pulse Rate 09/30/18 1656 75     Resp 09/30/18 1656 14     Temp 09/30/18 1656 98.4 F (36.9 C)     Temp Source 09/30/18 1656 Oral     SpO2 09/30/18 1656 100 %     Weight 09/30/18 1653 250 lb (113.4 kg)     Height 09/30/18 1653 5\' 4"  (1.626 m)     Head Circumference --      Peak Flow --      Pain Score 09/30/18  1653 0     Pain Loc --      Pain Edu? --      Excl. in GC? --    No data found.  Updated Vital Signs BP 121/78 (BP Location: Left Arm)   Pulse 75   Temp 98.4 F (36.9 C) (Oral)   Resp 14   Ht 5\' 4"  (1.626 m)   Wt 113.4 kg   SpO2 100%   BMI 42.91 kg/m   Visual Acuity Right Eye Distance:   Left Eye Distance:   Bilateral Distance:    Right Eye Near:   Left Eye Near:    Bilateral Near:     Physical Exam Vitals signs and nursing note reviewed.  Constitutional:      General: She is not in acute distress.    Appearance: She is not toxic-appearing or diaphoretic.  HENT:     Nose: Congestion and rhinorrhea present.  Eyes:     Conjunctiva/sclera:     Right eye: Right conjunctiva is injected.     Left eye: Left conjunctiva is injected.  Cardiovascular:     Rate and Rhythm: Normal rate.     Heart sounds: Normal heart sounds.  Pulmonary:     Effort: Pulmonary effort is normal. No respiratory distress.  Neurological:      Mental Status: She is alert.      UC Treatments / Results  Labs (all labs ordered are listed, but only abnormal results are displayed) Labs Reviewed - No data to display  EKG   Radiology No results found.  Procedures Procedures (including critical care time)  Medications Ordered in UC Medications - No data to display  Initial Impression / Assessment and Plan / UC Course  I have reviewed the triage vital signs and the nursing notes.  Pertinent labs & imaging results that were available during my care of the patient were reviewed by me and considered in my medical decision making (see chart for details).      Final Clinical Impressions(s) / UC Diagnoses   Final diagnoses:  Allergic rhinitis, unspecified seasonality, unspecified trigger     Discharge Instructions     Over the counter steroid nasal spray Over the counter zyrtec and benadryl    ED Prescriptions    None     1. diagnosis reviewed with patient 2. Recommend supportive treatment with  3. Follow-up prn if symptoms worsen or don't improve  PDMP not reviewed this encounter.   , MD 10/02/18 [phone]

## 2018-10-04 ENCOUNTER — Other Ambulatory Visit: Payer: Self-pay

## 2018-10-04 ENCOUNTER — Ambulatory Visit
Admission: EM | Admit: 2018-10-04 | Discharge: 2018-10-04 | Disposition: A | Discharge status: Home / Self Care | Payer: BC Managed Care – PPO | Attending: Emergency Medicine | Admitting: Emergency Medicine

## 2018-10-04 ENCOUNTER — Encounter: Payer: Self-pay | Admitting: Emergency Medicine

## 2018-10-04 DIAGNOSIS — J302 Other seasonal allergic rhinitis: Secondary | ICD-10-CM

## 2018-10-04 DIAGNOSIS — J0110 Acute frontal sinusitis, unspecified: Secondary | ICD-10-CM | POA: Diagnosis not present

## 2018-10-04 MED ORDER — AMOXICILLIN-POT CLAVULANATE 875-125 MG PO TABS: 1.0000 | ORAL_TABLET | Freq: Two times a day (BID) | ORAL | 0 refills | Status: DC

## 2018-10-04 NOTE — ED Provider Notes (Signed)
[facility] ____________________________________________  Time seen: Approximately 9:39 AM  I have reviewed the triage vital signs and the nursing notes.   HISTORY  Chief Complaint Allergies   HPI KEYSI Weaver is a 23 y.o. female presenting for evaluation of 1.5 weeks of runny nose, nasal congestion with gradual onset of sinus pain and pressure.  Patient reports history of recurrent seasonal allergies with same presentation in which she has been trying over-the-counter multiple medications without resolution.  Has been using nasal sprays over-the-counter, Claritin and decongestants.  No accompanying fevers.  Had negative COVID-19 testing last week.  Occasional cough but denies any chest pain or shortness of breath.  No known fevers.  Continues eat and drink well.  Reports otherwise doing well.  No LMP recorded. Patient has had an implant.    Past Medical History:  Diagnosis Date  . Cholelithiasis 10/11/2013  . Obesity affecting pregnancy 10/11/2013   Overview:  Pt on 81 mg ASA for PIH risk ( MFM) 15 # weight gain recommended   . Supervision of normal pregnancy 11/23/2013   Overview:   Factors complicating this pregnancy   Obesity  HGBA1C  10/23/13   5.4 Plan for twice weekly NST BMI 42.   Cholelithiasis  New onset HTN: BP 140/80, 144/88 on repeat. Yesterday. BP 160/90 in the office. [facility] labs normal 3/18. Monitored on L&D for 6 hours. BP 105-126/63-78 making me think that 160/90 was erroneous value. Ms. Heese's arm is large enough such that our cuff is borderl    Patient Active Problem List   Diagnosis Date Noted  . Biliary colic   . Supervision of normal pregnancy 11/23/2013  . Cholelithiasis 10/11/2013  . Obesity affecting pregnancy 10/11/2013    Past Surgical History:  Procedure Laterality Date  . CHOLECYSTECTOMY N/A 11/10/2016   Procedure: LAPAROSCOPIC CHOLECYSTECTOMY;  Surgeon: Lattie Haw, MD;  Location: [facility] ORS;  Service: General;   Laterality: N/A;  . NO PAST SURGERIES       No current facility-administered medications for this encounter.   Current Outpatient Medications:  .  amoxicillin-clavulanate (AUGMENTIN) 875-125 MG tablet, Take 1 tablet by mouth every 12 (twelve) hours., Disp: 20 tablet, Rfl: 0 .  etonogestrel (NEXPLANON) 68 MG IMPL implant, 1 each by Subdermal route once., Disp: , Rfl:  .  meclizine (ANTIVERT) 25 MG tablet, Take 1 tablet (25 mg total) by mouth 3 (three) times daily as needed for dizziness., Disp: 30 tablet, Rfl: 0  Allergies Patient has no known allergies.  Family History  Problem Relation Age of Onset  . Heart disease Mother   . Cancer Neg Hx     Social History Social History   Tobacco Use  . Smoking status: Current Some Day Smoker    Packs/day: 1.00    Years: 1.00    Pack years: 1.00    Types: Cigarettes  . Smokeless tobacco: Never Used  . Tobacco comment: 1 PACK WILL LAST HER A WEEK  Substance Use Topics  . Alcohol use: No  . Drug use: No    Review of Systems Constitutional: No fever ENT: No sore throat. As above.  Cardiovascular: Denies chest pain. Respiratory: Denies shortness of breath. Gastrointestinal: No abdominal pain.   Musculoskeletal: Negative for back pain. Skin: Negative for rash.  ____________________________________________   PHYSICAL EXAM:  VITAL SIGNS: ED Triage Vitals  Enc Vitals Group     BP 10/04/18 0913 113/67     Pulse Rate 10/04/18 0913 62     Resp 10/04/18  0913 18     Temp 10/04/18 0913 98.3 F (36.8 C)     Temp Source 10/04/18 0913 Oral     SpO2 10/04/18 0913 98 %     Weight 10/04/18 0914 225 lb (102.1 kg)     Height 10/04/18 0914 5\' 4"  (1.626 m)     Head Circumference --      Peak Flow --      Pain Score 10/04/18 0913 6     Pain Loc --      Pain Edu? --      Excl. in [place]? --     Constitutional: Alert and oriented. Well appearing and in no acute distress. Eyes: Conjunctivae are normal.  Head: Atraumatic.Mild to moderate  tenderness to palpation bilateral frontal and mild tenderness to bilateral maxillary sinuses. No swelling. No erythema.   Ears: no erythema, normal TMs bilaterally.   Nose: nasal congestion with bilateral nasal turbinate erythema and edema.   Mouth/Throat: Mucous membranes are moist.  Oropharynx non-erythematous.No tonsillar swelling or exudate.  Neck: No stridor.  No cervical spine tenderness to palpation. Hematological/Lymphatic/Immunilogical: No cervical lymphadenopathy. Cardiovascular: Normal rate, regular rhythm. Grossly normal heart sounds.  Good peripheral circulation. Respiratory: Normal respiratory effort.  No retractions. No wheezes, rales or rhonchi. Good air movement.  Musculoskeletal: Steady gait. Neurologic:  Normal speech and language. No gait instability. Skin:  Skin is warm, dry and intact. No rash noted. Psychiatric: Mood and affect are normal. Speech and behavior are normal. ___________________________________________   LABS (all labs ordered are listed, but only abnormal results are displayed)  Labs Reviewed - No data to display  INITIAL IMPRESSION / ASSESSMENT AND PLAN / ED COURSE  Pertinent labs & imaging results that were available during my care of the patient were reviewed by me and considered in my medical decision making (see chart for details).  Well-appearing patient.  No acute distress.  Suspect sinusitis secondary to allergic rhinitis.  Will treat with oral Augmentin.  Continue over-the-counter supportive care.  Work note given.Discussed indication, risks and benefits of medications with patient.  Discussed follow up with Primary care physician this week. Discussed follow up and return parameters including no resolution or any worsening concerns. Patient verbalized understanding and agreed to plan.   ____________________________________________   FINAL CLINICAL IMPRESSION(S) / ED DIAGNOSES  Final diagnoses:  Acute frontal sinusitis, recurrence not  specified  Seasonal allergic rhinitis, unspecified trigger     ED Discharge Orders         Ordered    amoxicillin-clavulanate (AUGMENTIN) 875-125 MG tablet  Every 12 hours     10/04/18 0938           Note: This dictation was prepared with Dragon dictation along with smaller phrase technology. Any transcriptional errors that result from this process are unintentional.         Marylene Land, NP 10/04/18 1009

## 2018-10-04 NOTE — ED Triage Notes (Signed)
Patient states she was here last week with allergy symptoms.  Patient states she is not any better, has had a negative covid test in past week

## 2018-10-04 NOTE — Discharge Instructions (Signed)
Take medication as prescribed. Rest. Drink plenty of fluids. Continue over the  counter medication.   Follow up with your primary care physician this week as needed. Return to Urgent care for new or worsening concerns.

## 2019-05-04 ENCOUNTER — Emergency Department
Admission: EM | Admit: 2019-05-04 | Discharge: 2019-05-04 | Disposition: A | Discharge status: Home / Self Care | Payer: BC Managed Care – PPO | Attending: Emergency Medicine | Admitting: Emergency Medicine

## 2019-05-04 ENCOUNTER — Emergency Department: Payer: BC Managed Care – PPO

## 2019-05-04 ENCOUNTER — Other Ambulatory Visit: Payer: Self-pay

## 2019-05-04 DIAGNOSIS — F17210 Nicotine dependence, cigarettes, uncomplicated: Secondary | ICD-10-CM | POA: Insufficient documentation

## 2019-05-04 DIAGNOSIS — S4991XA Unspecified injury of right shoulder and upper arm, initial encounter: Secondary | ICD-10-CM | POA: Diagnosis present

## 2019-05-04 DIAGNOSIS — Y929 Unspecified place or not applicable: Secondary | ICD-10-CM | POA: Insufficient documentation

## 2019-05-04 DIAGNOSIS — Y9389 Activity, other specified: Secondary | ICD-10-CM | POA: Insufficient documentation

## 2019-05-04 DIAGNOSIS — Y999 Unspecified external cause status: Secondary | ICD-10-CM | POA: Insufficient documentation

## 2019-05-04 DIAGNOSIS — S40011A Contusion of right shoulder, initial encounter: Secondary | ICD-10-CM | POA: Diagnosis not present

## 2019-05-04 MED ORDER — IBUPROFEN 800 MG PO TABS
800.0000 mg | ORAL_TABLET | Freq: Once | ORAL | Status: AC
  Administered 2019-05-04: 23:00:00 800 mg via ORAL
  Filled 2019-05-04: qty 1

## 2019-05-04 MED ORDER — CYCLOBENZAPRINE HCL 10 MG PO TABS
10.0000 mg | ORAL_TABLET | Freq: Once | ORAL | Status: AC
  Administered 2019-05-04: 10 mg via ORAL
  Filled 2019-05-04: qty 1

## 2019-05-04 MED ORDER — IBUPROFEN 600 MG PO TABS: 600.0000 mg | ORAL_TABLET | Freq: Four times a day (QID) | ORAL | 0 refills | Status: DC | PRN

## 2019-05-04 MED ORDER — TRAMADOL HCL 50 MG PO TABS
50.0000 mg | ORAL_TABLET | Freq: Once | ORAL | Status: AC
  Administered 2019-05-04: 50 mg via ORAL
  Filled 2019-05-04: qty 1

## 2019-05-04 MED ORDER — TRAMADOL HCL 50 MG PO TABS: 50.0000 mg | ORAL_TABLET | Freq: Four times a day (QID) | ORAL | 0 refills | Status: DC | PRN

## 2019-05-04 MED ORDER — CYCLOBENZAPRINE HCL 5 MG PO TABS: 5.0000 mg | ORAL_TABLET | Freq: Three times a day (TID) | ORAL | 0 refills | Status: DC | PRN

## 2019-05-04 NOTE — Discharge Instructions (Addendum)
Please work on gentle stretching exercises.  Take ibuprofen and Tylenol during the day.  You may use tramadol and Flexeril at nighttime for more severe pain.  Return to the ER for any worsening symptoms or to changes in your health.  Follow-up with orthopedics if continued pain that has not improved in the next 7 days.
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
## 2019-11-27 ENCOUNTER — Ambulatory Visit
Admission: EM | Admit: 2019-11-27 | Discharge: 2019-11-27 | Disposition: A | Discharge status: Home / Self Care | Payer: Medicaid Other | Attending: Emergency Medicine | Admitting: Emergency Medicine

## 2019-11-27 ENCOUNTER — Other Ambulatory Visit: Payer: Self-pay

## 2019-11-27 ENCOUNTER — Encounter: Payer: Self-pay | Admitting: Emergency Medicine

## 2019-11-27 DIAGNOSIS — S39012A Strain of muscle, fascia and tendon of lower back, initial encounter: Secondary | ICD-10-CM

## 2019-11-27 MED ORDER — NAPROXEN 250 MG PO TABS: 500.0000 mg | ORAL_TABLET | Freq: Two times a day (BID) | ORAL | 0 refills | Status: DC

## 2019-11-27 MED ORDER — CYCLOBENZAPRINE HCL 5 MG PO TABS: 5.0000 mg | ORAL_TABLET | Freq: Every day | ORAL | 0 refills | Status: DC
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
## 2019-11-27 NOTE — Discharge Instructions (Signed)
Light and regular activity as tolerated.  See exercises provided.  Heat application while active can help with muscle spasms.  Sleep with pillow under your knees.   Naproxen twice a day, take with food.  Flexeril as a muscle relaxer to be taken at night, as it can cause drowsiness.  Follow up with your primary care provider if pain persists, return for any worsening of symptoms.

## 2020-08-22 ENCOUNTER — Ambulatory Visit
Admission: EM | Admit: 2020-08-22 | Discharge: 2020-08-22 | Disposition: A | Discharge status: Home / Self Care | Payer: Medicaid Other | Attending: Emergency Medicine | Admitting: Emergency Medicine

## 2020-08-22 ENCOUNTER — Other Ambulatory Visit: Payer: Self-pay

## 2020-08-22 DIAGNOSIS — S39012A Strain of muscle, fascia and tendon of lower back, initial encounter: Secondary | ICD-10-CM

## 2020-08-22 MED ORDER — BACLOFEN 10 MG PO TABS: 10.0000 mg | ORAL_TABLET | Freq: Three times a day (TID) | ORAL | 0 refills | Status: DC

## 2020-08-22 MED ORDER — IBUPROFEN 600 MG PO TABS: 600.0000 mg | ORAL_TABLET | Freq: Four times a day (QID) | ORAL | 0 refills | Status: DC | PRN

## 2020-08-22 NOTE — ED Provider Notes (Signed)
[facility]    CSN: [id number] Arrival date & time: 08/22/20  1533      History   Chief Complaint Chief Complaint  Patient presents with   Back Pain    HPI Alicia Weaver is a 25 y.o. female.   HPI  73 old female here for evaluation of low back pain.  Patient reports she was at work today and she was lifting a 75 pound box without using her legs and felt a pain on her left low back.  She states that she has been taking ibuprofen for this which has given her mild relief.  She states that she does not have any previous history of injuries to her back, there is no numbness, tingling, or weakness in her extremity, the pain does not radiate into her buttock or her leg.  Past Medical History:  Diagnosis Date   Cholelithiasis 10/11/2013   Obesity affecting pregnancy 10/11/2013   Overview:  Pt on 81 mg ASA for PIH risk ( MFM) 15 # weight gain recommended    Supervision of normal pregnancy 11/23/2013   Overview:   Factors complicating this pregnancy   Obesity  HGBA1C  10/23/13   5.4 Plan for twice weekly NST BMI 42.   Cholelithiasis  New onset HTN: BP 140/80, 144/88 on repeat. Yesterday. BP 160/90 in the office. PIH labs normal 3/18. Monitored on L&D for 6 hours. BP 105-126/63-78 making me think that 160/90 was erroneous value. Ms. Hentges's arm is large enough such that our cuff is borderl    Patient Active Problem List   Diagnosis Date Noted   Biliary colic    Supervision of normal pregnancy 11/23/2013   Cholelithiasis 10/11/2013   Obesity affecting pregnancy 10/11/2013    Past Surgical History:  Procedure Laterality Date   CHOLECYSTECTOMY N/A 11/10/2016   Procedure: LAPAROSCOPIC CHOLECYSTECTOMY;  Surgeon: Lattie Haw, MD;  Location: [facility] ORS;  Service: General;  Laterality: N/A;    OB History   No obstetric history on file.      Home Medications    Prior to Admission medications   Medication Sig Start Date End Date Taking? Authorizing Provider   baclofen (LIORESAL) 10 MG tablet Take 1 tablet (10 mg total) by mouth 3 (three) times daily. 08/22/20  Yes Becky Augusta, NP  etonogestrel (NEXPLANON) 68 MG IMPL implant 1 each by Subdermal route once.   Yes [provider]  ibuprofen (ADVIL) 600 MG tablet Take 1 tablet (600 mg total) by mouth every 6 (six) hours as needed. 08/22/20  Yes Becky Augusta, NP    Family History Family History  Problem Relation Age of Onset   Heart disease Mother    Other Father        unknown medical history   Cancer Neg Hx     Social History Social History   Tobacco Use   Smoking status: Former    Packs/day: 1.00    Years: 1.00    Pack years: 1.00    Types: Cigarettes    Quit date: 07/11/2019    Years since quitting: 1.1   Smokeless tobacco: Never   Tobacco comments:    1 PACK WILL LAST HER A WEEK  Vaping Use   Vaping Use: Never used  Substance Use Topics   Alcohol use: No   Drug use: No     Allergies   Patient has no known allergies.   Review of Systems Review of Systems  Constitutional:  Negative for activity change, appetite  change and fever.  Musculoskeletal:  Positive for back pain.  Neurological:  Negative for weakness and numbness.  Hematological: Negative.   Psychiatric/Behavioral: Negative.      Physical Exam Triage Vital Signs ED Triage Vitals  Enc Vitals Group     BP 08/22/20 1614 135/76     Pulse Rate 08/22/20 1614 76     Resp 08/22/20 1614 16     Temp 08/22/20 1614 98.5 F (36.9 C)     Temp Source 08/22/20 1614 Oral     SpO2 08/22/20 1614 96 %     Weight 08/22/20 1612 250 lb (113.4 kg)     Height 08/22/20 1612 5\' 4"  (1.626 m)     Head Circumference --      Peak Flow --      Pain Score 08/22/20 1612 8     Pain Loc --      Pain Edu? --      Excl. in GC? --    No data found.  Updated Vital Signs BP 135/76 (BP Location: Right Arm)   Pulse 76   Temp 98.5 F (36.9 C) (Oral)   Resp 16   Ht 5\' 4"  (1.626 m)   Wt 250 lb (113.4 kg)   SpO2 96%   BMI  42.91 kg/m   Visual Acuity Right Eye Distance:   Left Eye Distance:   Bilateral Distance:    Right Eye Near:   Left Eye Near:    Bilateral Near:     Physical Exam Vitals and nursing note reviewed.  Constitutional:      General: She is not in acute distress.    Appearance: Normal appearance. She is obese. She is not ill-appearing.  HENT:     Head: Normocephalic and atraumatic.  Cardiovascular:     Rate and Rhythm: Normal rate and regular rhythm.     Pulses: Normal pulses.     Heart sounds: Normal heart sounds. No murmur heard.   No gallop.  Pulmonary:     Effort: Pulmonary effort is normal.     Breath sounds: Normal breath sounds. No wheezing, rhonchi or rales.  Musculoskeletal:        General: Tenderness present. No swelling or deformity.  Skin:    General: Skin is warm and dry.     Capillary Refill: Capillary refill takes less than 2 seconds.     Findings: No erythema or rash.  Neurological:     General: No focal deficit present.     Mental Status: She is alert and oriented to person, place, and time.  Psychiatric:        Mood and Affect: Mood normal.        Behavior: Behavior normal.        Thought Content: Thought content normal.        Judgment: Judgment normal.     UC Treatments / Results  Labs (all labs ordered are listed, but only abnormal results are displayed) Labs Reviewed - No data to display  EKG   Radiology No results found.  Procedures Procedures (including critical care time)  Medications Ordered in UC Medications - No data to display  Initial Impression / Assessment and Plan / UC Course  I have reviewed the triage vital signs and the nursing notes.  Pertinent labs & imaging results that were available during my care of the patient were reviewed by me and considered in my medical decision making (see chart for details).  Patient is a 9 toxic appearing  45 old female here for evaluation of left lower back pain that is been present since  today after she lifted a 70 pound box at work.  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  She has not injured back previously and she denies any radiation of the pain into her buttock or leg.  Also no numbness, tingling, weakness in her leg or foot.  Patient's physical exam reveals a cardiopulmonary dam is benign with clear lung sounds in all fields.  Patient has no midline spinal tenderness but she does have left lower paraspinous tenderness at the level of L5-S1.  Patient's bilateral lower extremity strength is 5/5 and lower extremity DTRs are 2+ bilaterally.  Patient exam is consistent with lumbar strain.  We will treat conservatively with ibuprofen, baclofen, moist heat, and back range of motion exercises.   Final Clinical Impressions(s) / UC Diagnoses   Final diagnoses:  Strain of lumbar region, initial encounter     Discharge Instructions      Take the ibuprofen, 600 mg every 6 hours with food, on a schedule for the next 48 hours and then as needed.  Take the baclofen, 10 mg every 8 hours, on a schedule for the next 48 hours and then as needed.  Apply moist heat to your back for 30 minutes at a time 2-3 times a day to improve blood flow to the area and help remove the lactic acid causing the spasm.  Follow the back exercises given at discharge.  Return for reevaluation for any new or worsening symptoms.      ED Prescriptions     Medication Sig Dispense Auth. Provider   ibuprofen (ADVIL) 600 MG tablet Take 1 tablet (600 mg total) by mouth every 6 (six) hours as needed. 30 tablet Becky Augusta, NP   baclofen (LIORESAL) 10 MG tablet Take 1 tablet (10 mg total) by mouth 3 (three) times daily. 30 each Becky Augusta, NP      PDMP not reviewed this encounter.   Becky Augusta, NP 08/22/20 1659

## 2020-08-22 NOTE — ED Triage Notes (Signed)
Patient complains of low back pain that started after picking up a box today. States that she has tried to walk it out, but has provided no relief.

## 2020-08-22 NOTE — Discharge Instructions (Addendum)
Take the ibuprofen, 600 mg every 6 hours with food, on a schedule for the next 48 hours and then as needed.  Take the baclofen, 10 mg every 8 hours, on a schedule for the next 48 hours and then as needed.  Apply moist heat to your back for 30 minutes at a time 2-3 times a day to improve blood flow to the area and help remove the lactic acid causing the spasm.  Follow the back exercises given at discharge.  Return for reevaluation for any new or worsening symptoms.  

## 2020-09-04 ENCOUNTER — Ambulatory Visit
Admission: EM | Admit: 2020-09-04 | Discharge: 2020-09-04 | Disposition: A | Discharge status: Home / Self Care | Payer: Medicaid Other | Attending: Sports Medicine | Admitting: Sports Medicine

## 2020-09-04 ENCOUNTER — Other Ambulatory Visit: Payer: Self-pay

## 2020-09-04 DIAGNOSIS — R059 Cough, unspecified: Secondary | ICD-10-CM | POA: Diagnosis not present

## 2020-09-04 DIAGNOSIS — J069 Acute upper respiratory infection, unspecified: Secondary | ICD-10-CM | POA: Insufficient documentation

## 2020-09-04 DIAGNOSIS — R0981 Nasal congestion: Secondary | ICD-10-CM | POA: Insufficient documentation

## 2020-09-04 DIAGNOSIS — R0982 Postnasal drip: Secondary | ICD-10-CM | POA: Insufficient documentation

## 2020-09-04 DIAGNOSIS — Z20822 Contact with and (suspected) exposure to covid-19: Secondary | ICD-10-CM | POA: Insufficient documentation

## 2020-09-04 MED ORDER — FLUTICASONE PROPIONATE 50 MCG/ACT NA SUSP: 2.0000 | Freq: Every day | NASAL | 0 refills | Status: DC

## 2020-09-04 MED ORDER — PROMETHAZINE-DM 6.25-15 MG/5ML PO SYRP: 5.0000 mL | ORAL_SOLUTION | Freq: Four times a day (QID) | ORAL | 0 refills | Status: DC | PRN

## 2020-09-04 NOTE — Discharge Instructions (Addendum)
As we discussed, your COVID test is pending.  I would asked that you isolate until you know the results.  Please follow along with the app on your phone called MyChart. Please see educational handouts. I did send in 2 medications to your pharmacy.  Please use as directed. Plenty of rest, plenty fluids, Tylenol or Motrin for any fever or discomfort. If your symptoms persist please see your primary care provider. If your symptoms worsen please go to the emergency room. I did give you a work note.

## 2020-09-04 NOTE — ED Provider Notes (Signed)
[facility]    CSN: [id number] Arrival date & time: 09/04/20  [id number]      History   Chief Complaint Chief Complaint  Patient presents with   Sore Throat   Nasal Congestion   Cough    HPI Alicia Weaver is a 25 y.o. female.   25 year old female who presents for evaluation of 4 days of URI symptoms.  She reports cough, rhinorrhea, nasal congestion, sneezing, mild sore throat, and postnasal drainage.  She does not have a history of seasonal allergies or asthma.  No chest pain or shortness of breath.  No wheezing.  No fever shakes chills.  No nausea vomiting or diarrhea.  No abdominal or urinary symptoms.  All her symptoms are confined to the upper respiratory tract.  She has not been vaccinated against COVID.  No known COVID exposure.  That [place] of people around.  She denies any current medical conditions and does not take medicines on a regular basis.  She works over at [employer].  She does not have a primary care provider.  No red flag signs or symptoms elicited on history.   Past Medical History:  Diagnosis Date   Cholelithiasis 10/11/2013   Obesity affecting pregnancy 10/11/2013   Overview:  Pt on 81 mg ASA for PIH risk ( MFM) 15 # weight gain recommended    Supervision of normal pregnancy 11/23/2013   Overview:   Factors complicating this pregnancy   Obesity  HGBA1C  10/23/13   5.4 Plan for twice weekly NST BMI 42.   Cholelithiasis  New onset HTN: BP 140/80, 144/88 on repeat. Yesterday. BP 160/90 in the office. PIH labs normal 3/18. Monitored on L&D for 6 hours. BP 105-126/63-78 making me think that 160/90 was erroneous value. Ms. Alphin's arm is large enough such that our cuff is borderl    Patient Active Problem List   Diagnosis Date Noted   Biliary colic    Supervision of normal pregnancy 11/23/2013   Cholelithiasis 10/11/2013   Obesity affecting pregnancy 10/11/2013    Past Surgical History:   Procedure Laterality Date   CHOLECYSTECTOMY N/A 11/10/2016   Procedure: LAPAROSCOPIC CHOLECYSTECTOMY;  Surgeon: Lattie Hawooper, Richard E, MD;  Location: [facility] ORS;  Service: General;  Laterality: N/A;    OB History   No obstetric history on file.      Home Medications    Prior to Admission medications   Medication Sig Start Date End Date Taking? Authorizing Provider  baclofen (LIORESAL) 10 MG tablet Take 1 tablet (10 mg total) by mouth 3 (three) times daily. 08/22/20  Yes Becky Augustayan, Jeremy, NP  etonogestrel (NEXPLANON) 68 MG IMPL implant 1 each by Subdermal route once.   Yes [provider]  fluticasone (FLONASE) 50 MCG/ACT nasal spray Place 2 sprays into both nostrils daily. 09/04/20  Yes Delton SeeBarnes, Letita Prentiss, MD  promethazine-dextromethorphan (PROMETHAZINE-DM) 6.25-15 MG/5ML syrup Take 5 mLs by mouth 4 (four) times daily as needed for cough. 09/04/20  Yes Delton SeeBarnes, Ayaana Biondo, MD  ibuprofen (ADVIL) 600 MG tablet Take 1 tablet (600 mg total) by mouth every 6 (six) hours as needed. 08/22/20   Becky Augustayan, Jeremy, NP    Family History Family History  Problem Relation Age of Onset   Heart disease Mother    Other Father        unknown medical history   Cancer Neg Hx     Social History Social History   Tobacco Use   Smoking  status: Former    Packs/day: 1.00    Years: 1.00    Pack years: 1.00    Types: Cigarettes    Quit date: 07/11/2019    Years since quitting: 1.1   Smokeless tobacco: Never   Tobacco comments:    1 PACK WILL LAST HER A WEEK  Vaping Use   Vaping Use: Never used  Substance Use Topics   Alcohol use: No   Drug use: No     Allergies   Patient has no known allergies.   Review of Systems Review of Systems  Constitutional:  Negative for activity change, appetite change, chills, diaphoresis, fatigue and fever.  HENT:  Positive for congestion, postnasal drip, rhinorrhea and sore throat. Negative for ear pain, sinus pressure, sinus pain, sneezing and voice change.   Eyes:   Negative for pain.  Respiratory:  Positive for cough. Negative for chest tightness, shortness of breath and wheezing.   Cardiovascular:  Negative for chest pain and palpitations.  Gastrointestinal:  Negative for abdominal pain, diarrhea, nausea and vomiting.  Genitourinary:  Negative for dysuria.  Musculoskeletal:  Negative for back pain, myalgias and neck pain.  Skin:  Negative for color change, pallor, rash and wound.  Neurological:  Negative for dizziness, syncope, light-headedness and headaches.  All other systems reviewed and are negative.   Physical Exam Triage Vital Signs ED Triage Vitals  Enc Vitals Group     BP 09/04/20 1022 115/70     Pulse Rate 09/04/20 1022 67     Resp 09/04/20 1022 18     Temp 09/04/20 1022 98.3 F (36.8 C)     Temp Source 09/04/20 1022 Oral     SpO2 09/04/20 1022 100 %     Weight 09/04/20 1020 250 lb (113.4 kg)     Height 09/04/20 1020 5\' 4"  (1.626 m)     Head Circumference --      Peak Flow --      Pain Score 09/04/20 1019 8     Pain Loc --      Pain Edu? --      Excl. in GC? --    No data found.  Updated Vital Signs BP 115/70 (BP Location: Left Arm)   Pulse 67   Temp 98.3 F (36.8 C) (Oral)   Resp 18   Ht 5\' 4"  (1.626 m)   Wt 113.4 kg   SpO2 100%   BMI 42.91 kg/m   Visual Acuity Right Eye Distance:   Left Eye Distance:   Bilateral Distance:    Right Eye Near:   Left Eye Near:    Bilateral Near:     Physical Exam Vitals and nursing note reviewed.  Constitutional:      General: She is not in acute distress.    Appearance: Normal appearance. She is well-developed. She is not ill-appearing, toxic-appearing or diaphoretic.  HENT:     Head: Normocephalic and atraumatic.     Nose: Congestion and rhinorrhea present.     Mouth/Throat:     Mouth: Mucous membranes are moist. No oral lesions.     Pharynx: Uvula midline. Posterior oropharyngeal erythema present. No pharyngeal swelling, oropharyngeal exudate or uvula swelling.      Tonsils: No tonsillar exudate or tonsillar abscesses. 0 on the right. 0 on the left.  Eyes:     Conjunctiva/sclera: Conjunctivae normal.     Pupils: Pupils are equal, round, and reactive to light.  Cardiovascular:     Rate and Rhythm: Normal rate and  regular rhythm.     Pulses: Normal pulses.     Heart sounds: Normal heart sounds. No murmur heard.   No friction rub. No gallop.  Pulmonary:     Effort: Pulmonary effort is normal.     Breath sounds: Normal breath sounds. No stridor. No wheezing, rhonchi or rales.  Musculoskeletal:     Cervical back: Normal range of motion and neck supple.  Skin:    General: Skin is warm and dry.     Capillary Refill: Capillary refill takes less than 2 seconds.  Neurological:     General: No focal deficit present.     Mental Status: She is alert and oriented to person, place, and time.     UC Treatments / Results  Labs (all labs ordered are listed, but only abnormal results are displayed) Labs Reviewed  SARS CORONAVIRUS 2 (TAT 6-24 HRS)    EKG   Radiology No results found.  Procedures Procedures (including critical care time)  Medications Ordered in UC Medications - No data to display  Initial Impression / Assessment and Plan / UC Course  I have reviewed the triage vital signs and the nursing notes.  Pertinent labs & imaging results that were available during my care of the patient were reviewed by me and considered in my medical decision making (see chart for details).  Clinical impression: 1.  Viral upper respiratory tract infection 2.  Nasal congestion 3.  Cough 4.  Postnasal drip  Treatment plan: 1.  The findings and treatment plan were discussed in detail with the patient.  Patient was in agreement. 2.  We will go ahead and test her for COVID.  Was sent to the hospital.  I asked her to follow along with the app on her phone or computer called MyChart. 3.  Gave her a work note keep her out today.  If her test results are  negative she can go back to work tomorrow.  If it is positive someone will contact her and update her work note and keep her out of work per the current CDC guidelines for at least 5 days. 4.  Did send in 2 medications to her pharmacy.  This was for cough and nasal congestion. 5.  Educational handouts provided. 6.  Plenty of rest, plenty of fluids, Tylenol or Motrin for any fever or discomfort. 7.  Asked her to follow-up with her PCP if she has one if symptoms persist.  If they continue to persist that she does not have a PCP she should follow-up here or at another urgent care. 8.  If symptoms were to worsen in any way she should go to the ER. 9.  She was stable upon discharge and will follow-up here as needed.    Final Clinical Impressions(s) / UC Diagnoses   Final diagnoses:  Viral upper respiratory tract infection  Cough  Nasal congestion  Postnasal drip     Discharge Instructions      As we discussed, your COVID test is pending.  I would asked that you isolate until you know the results.  Please follow along with the app on your phone called MyChart. Please see educational handouts. I did send in 2 medications to your pharmacy.  Please use as directed. Plenty of rest, plenty fluids, Tylenol or Motrin for any fever or discomfort. If your symptoms persist please see your primary care provider. If your symptoms worsen please go to the emergency room. I did give you a work note.  ED Prescriptions     Medication Sig Dispense Auth. Provider   promethazine-dextromethorphan (PROMETHAZINE-DM) 6.25-15 MG/5ML syrup Take 5 mLs by mouth 4 (four) times daily as needed for cough. 180 mL Delton See, MD   fluticasone [facility]) 50 MCG/ACT nasal spray Place 2 sprays into both nostrils daily. 15.8 mL Delton See, MD      PDMP not reviewed this encounter.   Delton See, MD 09/04/20 [phone]

## 2020-09-04 NOTE — ED Triage Notes (Signed)
Pt c/o runny nose, cough, sneezing, sore throat and watery eyes since this past Sunday. Pt denies f/n/v/d or other symptoms.

## 2020-09-05 LAB — SARS CORONAVIRUS 2 (TAT 6-24 HRS): SARS Coronavirus 2: NEGATIVE

## 2020-10-21 IMAGING — CR RIGHT KNEE - COMPLETE 4+ VIEW
4 series · 4 of 4 positions shown · non-contrast
Comparison: None.

CLINICAL DATA: Medial knee pain after fall this morning.

EXAM:
RIGHT KNEE - COMPLETE 4+ VIEW

[knee lat]
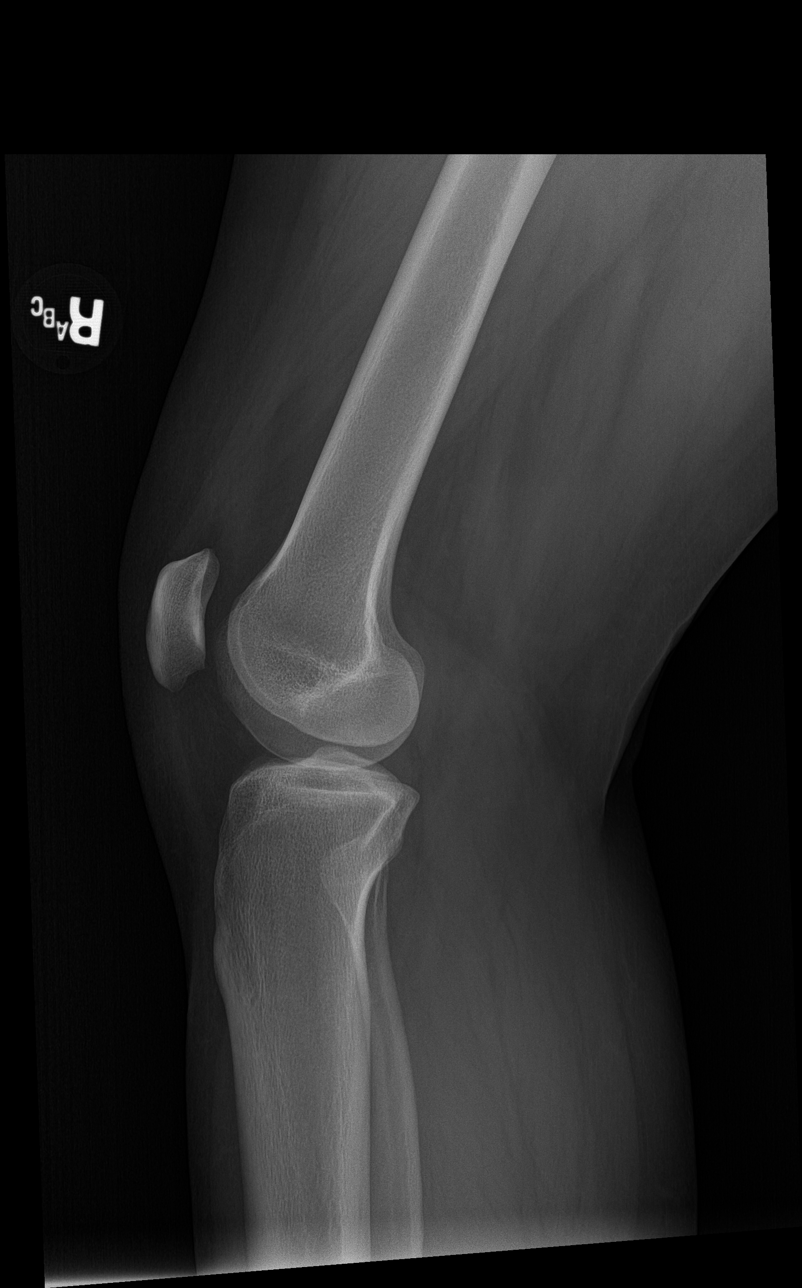

[tunnel]
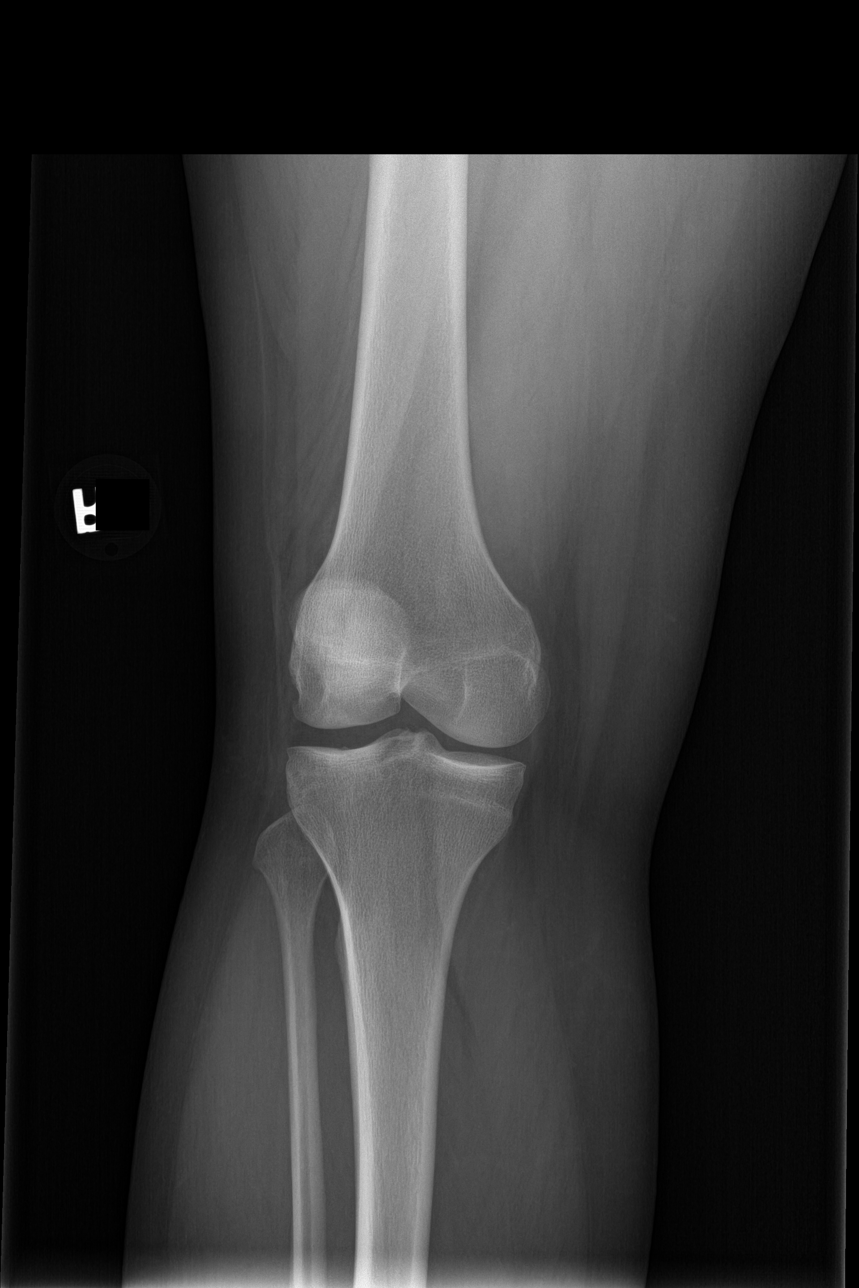

[patella skyline]
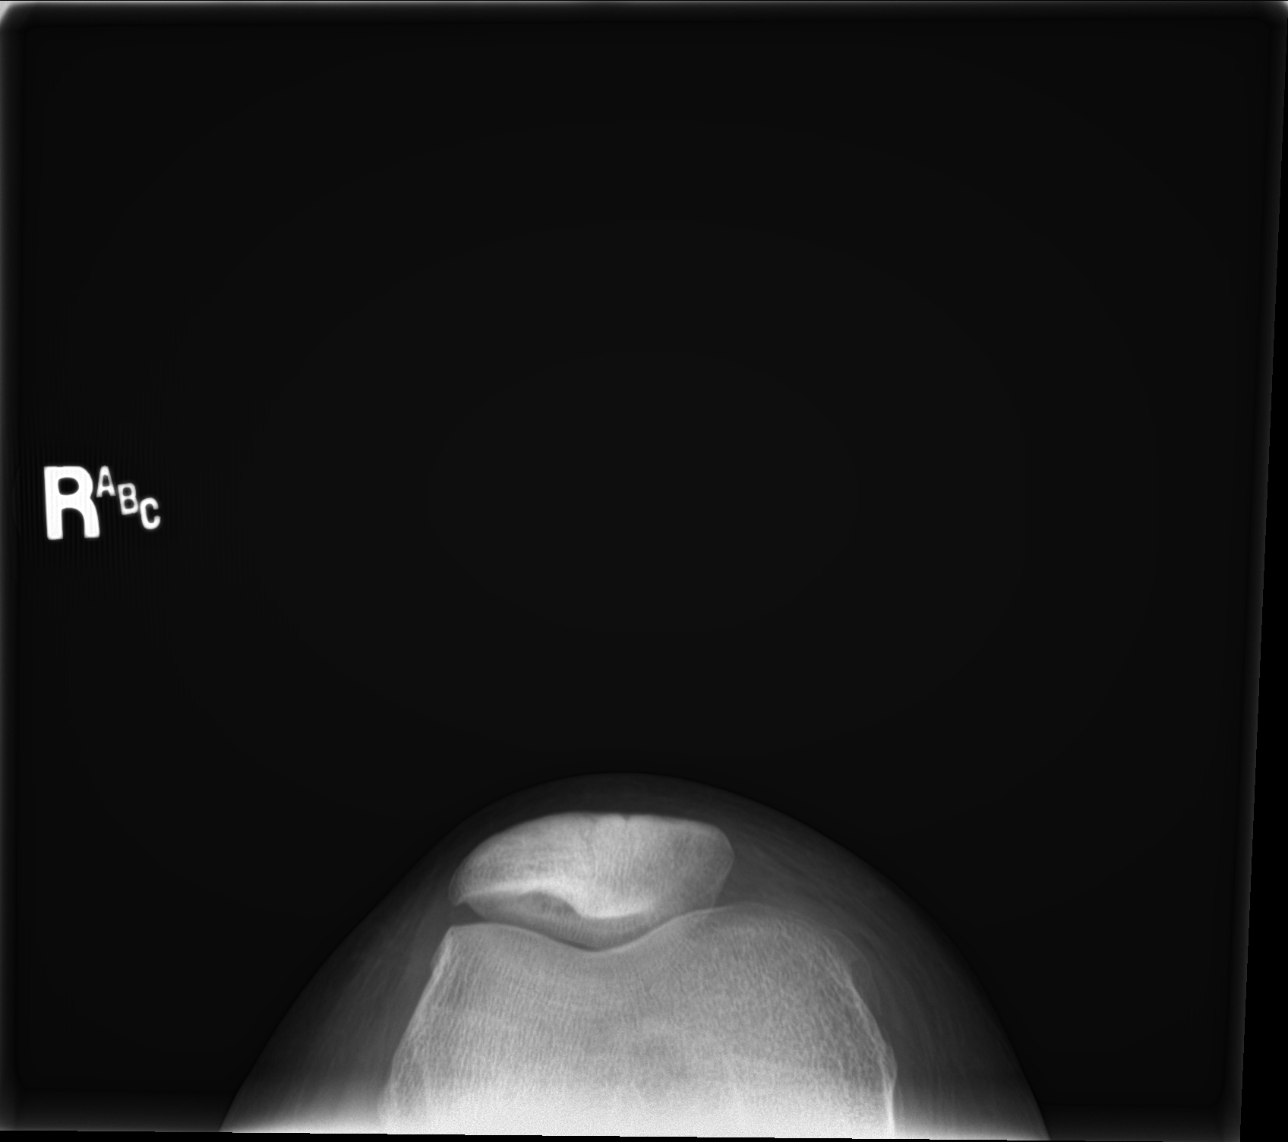

[knee ap]
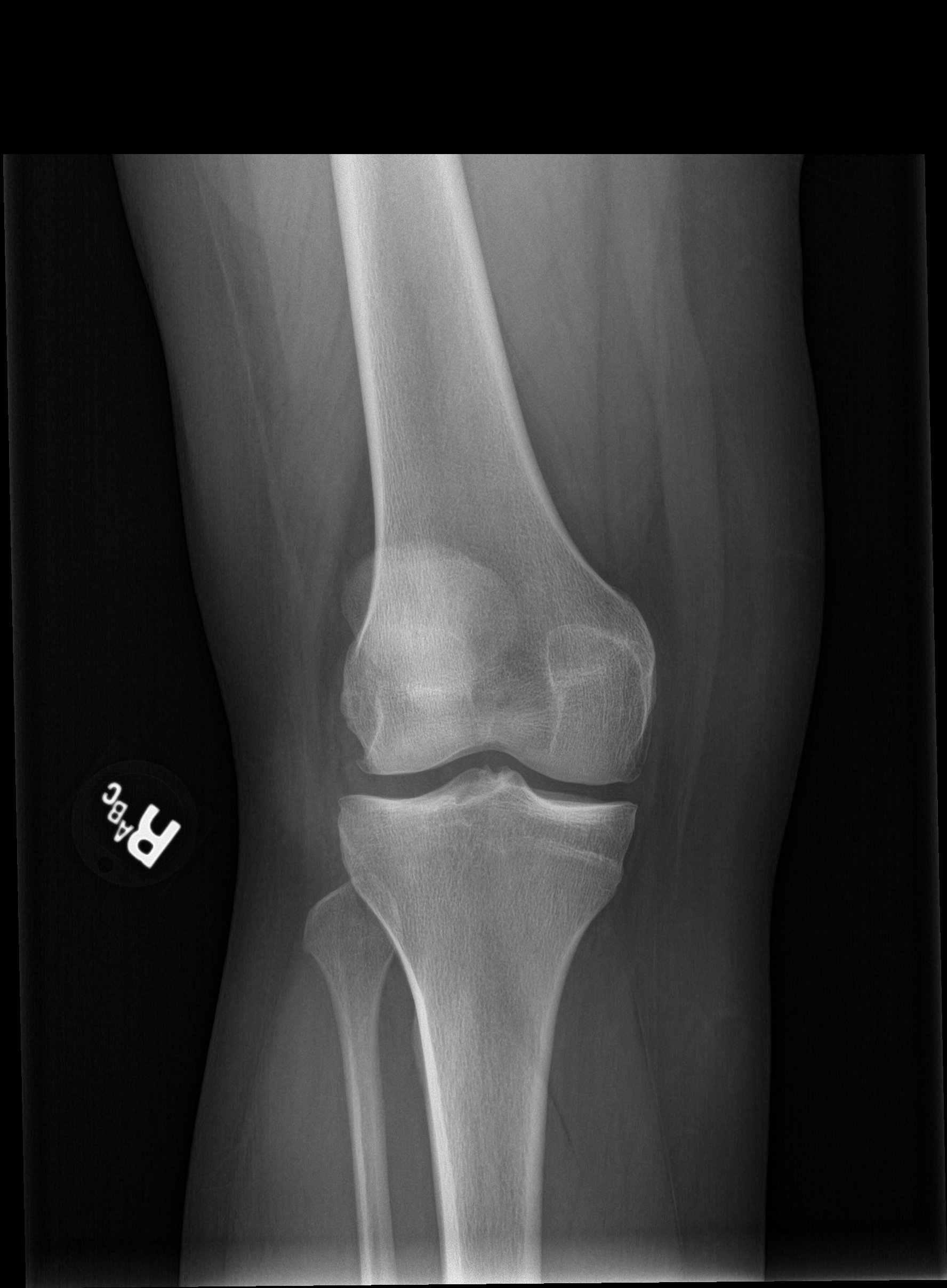

[4 of 4 positions shown; findings below may reference images not displayed]

FINDINGS: No acute fracture or dislocation. No joint effusion. Joint spaces
are preserved. Small medial and lateral marginal compartment
osteophytes. Bone mineralization is normal. Soft tissues are
unremarkable.
IMPRESSION: 1.  No acute osseous abnormality.
2. Early degenerative osteophytosis.
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
## 2021-10-26 ENCOUNTER — Ambulatory Visit (INDEPENDENT_AMBULATORY_CARE_PROVIDER_SITE_OTHER): Payer: Medicaid Other

## 2021-10-26 ENCOUNTER — Ambulatory Visit
Admission: EM | Admit: 2021-10-26 | Discharge: 2021-10-26 | Disposition: A | Discharge status: Home / Self Care | Payer: Medicaid Other | Attending: Emergency Medicine | Admitting: Emergency Medicine

## 2021-10-26 DIAGNOSIS — M25562 Pain in left knee: Secondary | ICD-10-CM | POA: Diagnosis not present

## 2021-10-26 DIAGNOSIS — S8392XA Sprain of unspecified site of left knee, initial encounter: Secondary | ICD-10-CM | POA: Diagnosis not present

## 2021-10-26 DIAGNOSIS — M25462 Effusion, left knee: Secondary | ICD-10-CM | POA: Diagnosis not present

## 2021-10-26 MED ORDER — OXYCODONE-ACETAMINOPHEN 5-325 MG PO TABS: 1.0000 | ORAL_TABLET | Freq: Four times a day (QID) | ORAL | 0 refills | Status: DC | PRN

## 2021-10-26 MED ORDER — IBUPROFEN 600 MG PO TABS: 600.0000 mg | ORAL_TABLET | Freq: Four times a day (QID) | ORAL | 0 refills | Status: DC | PRN

## 2021-10-26 NOTE — Discharge Instructions (Addendum)
Your x-rays today did not show any evidence of broken bones but there is a large amount of fluid in your knee which indicates that you may have injured some of the soft tissues in your knee.  Wear the hinged knee brace when you are up and moving to protect your knee from further injury.  You may take it off to bathe and sleep.  Use the crutches to assist with walking.  You may weight-bear as tolerated.  You should keep your left knee elevated is much as possible to decrease inflammation and aid in pain relief.  Apply ice to your knee for 20 minutes at a time 2-3 times a day to help with pain and inflammation.  Make sure that there is a cloth between the ice and your skin so as to not cause skin damage.  Take 600 mg of ibuprofen with food every 6 hours as needed for mild to moderate pain.  Use the Percocet as needed for severe pain.  You can have 1 to 2 tablets every 6 hours.  Be mindful that this medication will make you drowsy and you should not drive or operate heavy machinery while taking it.  Also do not drink alcohol while taking it.  You need to be evaluated by orthopedics and I recommend following up with [facility] orthopedics through their [facility] walk-in urgent care locations.  They have 2 in [place].  One is on [address] and the other 1 is on [address].  I would go and seek care there tomorrow.

## 2021-10-26 NOTE — ED Provider Notes (Signed)
[facility]    CSN: [id number] Arrival date & time: 10/26/21  1111      History   Chief Complaint Chief Complaint  Patient presents with   Knee Injury    LT knee    HPI Alicia Weaver is a 26 y.o. female.   HPI  26 year old female here for evaluation of left knee pain.  The patient reports that she was unloading groceries from the back of a pickup truck last night and when she jumped out of the bed of the truck her knee buckled when she landed and she felt a pop in her left knee.  She states that it started to swell and she is having difficulty with weightbearing as well as bending her left knee.  She also has intermittent numbness and tingling in her toes and states that she cannot feel her toes right now.  Past Medical History:  Diagnosis Date   Cholelithiasis 10/11/2013   Obesity affecting pregnancy 10/11/2013   Overview:  Pt on 81 mg ASA for PIH risk ( MFM) 15 # weight gain recommended    Supervision of normal pregnancy 11/23/2013   Overview:   Factors complicating this pregnancy   Obesity  HGBA1C  10/23/13   5.4 Plan for twice weekly NST BMI 42.   Cholelithiasis  New onset HTN: BP 140/80, 144/88 on repeat. Yesterday. BP 160/90 in the office. PIH labs normal 3/18. Monitored on L&D for 6 hours. BP 105-126/63-78 making me think that 160/90 was erroneous value. Ms. Petit's arm is large enough such that our cuff is borderl    Patient Active Problem List   Diagnosis Date Noted   Biliary colic    Supervision of normal pregnancy 11/23/2013   Cholelithiasis 10/11/2013   Obesity affecting pregnancy 10/11/2013    Past Surgical History:  Procedure Laterality Date   CHOLECYSTECTOMY N/A 11/10/2016   Procedure: LAPAROSCOPIC CHOLECYSTECTOMY;  Surgeon: Lattie Haw, MD;  Location: [facility] ORS;  Service: General;  Laterality: N/A;    OB History   No obstetric history on file.      Home Medications    Prior to Admission medications   Medication Sig  Start Date End Date Taking? Authorizing Provider  ibuprofen (ADVIL) 600 MG tablet Take 1 tablet (600 mg total) by mouth every 6 (six) hours as needed. 10/26/21  Yes Becky Augusta, NP  oxyCODONE-acetaminophen (PERCOCET/ROXICET) 5-325 MG tablet Take 1-2 tablets by mouth every 6 (six) hours as needed for severe pain. 10/26/21  Yes Becky Augusta, NP  etonogestrel (NEXPLANON) 68 MG IMPL implant 1 each by Subdermal route once.    [provider]  fluticasone (FLONASE) 50 MCG/ACT nasal spray Place 2 sprays into both nostrils daily. 09/04/20   Delton See, MD    Family History Family History  Problem Relation Age of Onset   Heart disease Mother    Other Father        unknown medical history   Cancer Neg Hx     Social History Social History   Tobacco Use   Smoking status: Former    Packs/day: 1.00    Years: 1.00    Total pack years: 1.00    Types: Cigarettes    Quit date: 07/11/2019    Years since quitting: 2.2   Smokeless tobacco: Never   Tobacco comments:    1 PACK WILL LAST HER A WEEK  Vaping Use   Vaping Use: Never used  Substance Use Topics   Alcohol use: No  Drug use: No     Allergies   Patient has no known allergies.   Review of Systems Review of Systems  Musculoskeletal:  Positive for arthralgias and joint swelling.  Skin:  Negative for color change.  Neurological:  Positive for numbness. Negative for weakness.  Hematological: Negative.   Psychiatric/Behavioral: Negative.       Physical Exam Triage Vital Signs ED Triage Vitals  Enc Vitals Group     BP 10/26/21 1143 (!) 149/84     Pulse Rate 10/26/21 1143 71     Resp --      Temp 10/26/21 1143 98.1 F (36.7 C)     Temp Source 10/26/21 1143 Oral     SpO2 10/26/21 1143 98 %     Weight 10/26/21 1142 205 lb (93 kg)     Height 10/26/21 1142 5\' 4"  (1.626 m)     Head Circumference --      Peak Flow --      Pain Score 10/26/21 1142 7     Pain Loc --      Pain Edu? --      Excl. in GC? --    No  data found.  Updated Vital Signs BP (!) 149/84 (BP Location: Left Arm)   Pulse 71   Temp 98.1 F (36.7 C) (Oral)   Ht 5\' 4"  (1.626 m)   Wt 205 lb (93 kg)   SpO2 98%   BMI 35.19 kg/m   Visual Acuity Right Eye Distance:   Left Eye Distance:   Bilateral Distance:    Right Eye Near:   Left Eye Near:    Bilateral Near:     Physical Exam Vitals and nursing note reviewed.  Constitutional:      General: She is in acute distress.     Appearance: Normal appearance. She is not ill-appearing.     Comments: Patient appears to be in a moderate degree of pain.  Musculoskeletal:        General: Swelling, tenderness and signs of injury present. No deformity.  Skin:    General: Skin is warm and dry.     Capillary Refill: Capillary refill takes less than 2 seconds.     Findings: No bruising or erythema.  Neurological:     General: No focal deficit present.     Mental Status: She is alert and oriented to person, place, and time.     Motor: No weakness.  Psychiatric:        Mood and Affect: Mood normal.        Behavior: Behavior normal.        Thought Content: Thought content normal.        Judgment: Judgment normal.      UC Treatments / Results  Labs (all labs ordered are listed, but only abnormal results are displayed) Labs Reviewed - No data to display  EKG   Radiology DG Knee Complete 4 Views Left  Result Date: 10/26/2021 CLINICAL DATA:  Left knee pain after jumping out of a truck yesterday. She states that her knee gave way. EXAM: LEFT KNEE - COMPLETE 4+ VIEW COMPARISON:  None Available. FINDINGS: A large joint effusion is present. The joint is located. No acute fracture or foreign body is present. IMPRESSION: Large joint effusion without underlying fracture. Ligamentous injury is not excluded. Electronically Signed   By: M.D.   On: 10/26/2021 12:04    Procedures Procedures (including critical care time)  Medications Ordered in UC Medications -  No  data to display  Initial Impression / Assessment and Plan / UC Course  I have reviewed the triage vital signs and the nursing notes.  Pertinent labs & imaging results that were available during my care of the patient were reviewed by me and considered in my medical decision making (see chart for details).   Patient is a pleasant 26 year old female who appears to be in a moderate degree of pain here for evaluation of pain in her left knee that started last night after she jumped out of the bed of a pickup truck.  She reports that when she landed her knee buckled and she felt a pop.  She says she has been unable to fully extend her knee since then and she has pain with weightbearing.  She also reports swelling.  On exam her knee is in normal anatomical alignment but she does have pain with palpation of her patella, medial lateral joint line, popliteal fossa.  No tenderness to palpation of the tibial tuberosity.  I am unable to get her to full extension even passively.  She does have pain on the medial aspect of her knee with valgus stress application.  We will obtain radiograph of left knee to rule out any bony abnormality.  Radiology impression states there is a large joint effusion without underlying fracture.  Ligamentous injury is not excluded.  I will discharge patient home in a hinged knee brace and crutches with limited weightbearing and have her follow-up with [facility] orthopedics through their walk-in urgent care tomorrow.  Work note provided.  I have also given her 60 mg of ibuprofen that she can take every 6 hours as needed for pain and will advise her to elevate her knee is much as possible, apply ice to help decrease the inflammation and aid in pain relief, and to rest her knee is much as possible.  I will also give her a short prescription for Percocet that she can use for more severe pain.  PDMP does not reveal any open narcotic scripts.   Final Clinical Impressions(s) / UC Diagnoses   Final  diagnoses:  Sprain of left knee, unspecified ligament, initial encounter  Effusion of left knee     Discharge Instructions      Your x-rays today did not show any evidence of broken bones but there is a large amount of fluid in your knee which indicates that you may have injured some of the soft tissues in your knee.  Wear the hinged knee brace when you are up and moving to protect your knee from further injury.  You may take it off to bathe and sleep.  Use the crutches to assist with walking.  You may weight-bear as tolerated.  You should keep your left knee elevated is much as possible to decrease inflammation and aid in pain relief.  Apply ice to your knee for 20 minutes at a time 2-3 times a day to help with pain and inflammation.  Make sure that there is a cloth between the ice and your skin so as to not cause skin damage.  Take 600 mg of ibuprofen with food every 6 hours as needed for mild to moderate pain.  Use the Percocet as needed for severe pain.  You can have 1 to 2 tablets every 6 hours.  Be mindful that this medication will make you drowsy and you should not drive or operate heavy machinery while taking it.  Also do not drink alcohol while taking it.  You need to be evaluated by orthopedics and I recommend following up with [facility] orthopedics through their [facility] walk-in urgent care locations.  They have 2 in [place].  One is on [address] and the other 1 is on [address].  I would go and seek care there tomorrow.     ED Prescriptions     Medication Sig Dispense Auth. Provider   ibuprofen (ADVIL) 600 MG tablet Take 1 tablet (600 mg total) by mouth every 6 (six) hours as needed. 30 tablet Becky Augusta, NP   oxyCODONE-acetaminophen (PERCOCET/ROXICET) 5-325 MG tablet Take 1-2 tablets by mouth every 6 (six) hours as needed for severe pain. 15 tablet Becky Augusta, NP      I have reviewed the PDMP during this encounter.   Becky Augusta, NP 10/26/21  1235

## 2021-10-26 NOTE — ED Triage Notes (Signed)
Pt states she was unloading groceries from truck & after she jumped felt a po LT knee, swelling DOI: 10/25/21, pain when walking or bending

## 2022-04-08 ENCOUNTER — Encounter: Payer: Self-pay | Admitting: Physician Assistant

## 2022-04-08 ENCOUNTER — Telehealth: Payer: Self-pay | Admitting: Physician Assistant

## 2022-04-08 ENCOUNTER — Ambulatory Visit: Payer: Medicaid Other | Admitting: Physician Assistant

## 2022-04-08 ENCOUNTER — Other Ambulatory Visit: Payer: Self-pay | Admitting: Physician Assistant

## 2022-04-08 VITALS — BP 128/90 | HR 74 | Temp 98.1°F | Ht 64.0 in | Wt 263.0 lb

## 2022-04-08 DIAGNOSIS — F322 Major depressive disorder, single episode, severe without psychotic features: Secondary | ICD-10-CM

## 2022-04-08 DIAGNOSIS — F419 Anxiety disorder, unspecified: Secondary | ICD-10-CM

## 2022-04-08 DIAGNOSIS — J351 Hypertrophy of tonsils: Secondary | ICD-10-CM

## 2022-04-08 DIAGNOSIS — R0681 Apnea, not elsewhere classified: Secondary | ICD-10-CM

## 2022-04-08 DIAGNOSIS — F32A Depression, unspecified: Secondary | ICD-10-CM

## 2022-04-08 DIAGNOSIS — I10 Essential (primary) hypertension: Secondary | ICD-10-CM

## 2022-04-08 DIAGNOSIS — E66813 Obesity, class 3: Secondary | ICD-10-CM | POA: Insufficient documentation

## 2022-04-08 DIAGNOSIS — Z1321 Encounter for screening for nutritional disorder: Secondary | ICD-10-CM

## 2022-04-08 DIAGNOSIS — Z87898 Personal history of other specified conditions: Secondary | ICD-10-CM

## 2022-04-08 DIAGNOSIS — R430 Anosmia: Secondary | ICD-10-CM

## 2022-04-08 DIAGNOSIS — Z131 Encounter for screening for diabetes mellitus: Secondary | ICD-10-CM

## 2022-04-08 DIAGNOSIS — Z6841 Body Mass Index (BMI) 40.0 and over, adult: Secondary | ICD-10-CM

## 2022-04-08 DIAGNOSIS — R5383 Other fatigue: Secondary | ICD-10-CM

## 2022-04-08 MED ORDER — FLUOXETINE HCL 20 MG PO TABS: 20.0000 mg | ORAL_TABLET | Freq: Every day | ORAL | 1 refills | Status: DC

## 2022-04-08 NOTE — Progress Notes (Signed)
Date:  04/08/2022   Name:  Alicia Weaver   DOB:  1995/12/27   MRN:  [id number]   Chief Complaint: [place] and Snoring  HPI Alicia Weaver is a very pleasant 27 year old female with obesity and gestational prediabetes new to the practice today to establish care and discuss several issues:  Depression - patient endorses worsening feelings of depression for the last 8 months, namely fatigue, anhedonia, depressed mood, and low self-worth.  States "I just really do not like myself sometimes".  She states that she has had periods of low mood off and on for many years, but never this severe and never requiring medication or behavioral health intervention.  She denies thoughts of self-harm now or in the past; states her responsibility for 32-year-old son keeps her grounded.  Suspected OSA - Alicia Weaver has snored for 5-6 years, gradually worsening over time, aware of snoring during the night with multiple nighttime awakenings.  Her mother tells her that she has episodes where she stops breathing for several seconds.  She has never had sleep study performed.  Reports excessive daytime sleepiness and can fall asleep quickly if she sits down in the middle of the day.  Curiously, she also reports anosmia and wonders if this and snoring may be related to large tonsils/adenoids.  Her sister also had trouble with snoring and anosmia which improved s/p tonsillectomy & adenoidectomy as an adult.  Patient denies recurrent strep infections.  Obesity - not discussed at length today, but patient mentions that she is concerned with her weight. While she would be interested in making some lifestyle changes, she finds it difficult to do this in her current state of depression given her fatigue and amotivation   Recent Labs     Component Value Date/Time   NA 138 11/09/2016 1011   NA 136 03/29/2014 1245   K 3.8 11/09/2016 1011   K 4.3 03/29/2014 1245   CL 105 11/09/2016 1011   CL 105 03/29/2014 1245   CO2 25  11/09/2016 1011   CO2 23 03/29/2014 1245   GLUCOSE 78 11/09/2016 1011   GLUCOSE 75 03/29/2014 1245   BUN 13 11/09/2016 1011   BUN 9 03/29/2014 1245   CREATININE 0.90 11/09/2016 1011   CREATININE 0.52 03/29/2014 1245   CALCIUM 9.3 11/09/2016 1011   CALCIUM 9.0 03/29/2014 1245   PROT 7.4 11/09/2016 1011   PROT 7.2 10/07/2013 0324   ALBUMIN 4.2 11/09/2016 1011   ALBUMIN 3.6 (L) 10/07/2013 0324   AST 23 11/09/2016 1011   AST 17 03/29/2014 1245   ALT 30 11/09/2016 1011   ALT 60 10/07/2013 0324   ALKPHOS 55 11/09/2016 1011   ALKPHOS 68 10/07/2013 0324   BILITOT 1.1 11/09/2016 1011   BILITOT 0.2 10/07/2013 0324   GFRNONAA >60 11/09/2016 1011   GFRNONAA >60 03/29/2014 1245   GFRAA >60 11/09/2016 1011   GFRAA >60 03/29/2014 1245    Lab Results  Component Value Date   WBC 7.6 11/09/2016   HGB 14.8 11/09/2016   HCT 44.3 11/09/2016   MCV 90.9 11/09/2016   PLT 232 11/09/2016   Lab Results  Component Value Date   HGBA1C 5.4 10/23/2013   No results found for: "CHOL", "HDL", "LDLCALC", "LDLDIRECT", "TRIG", "CHOLHDL" No results found for: "TSH"  Review of Systems  Constitutional:  Positive for fatigue.  Respiratory:  Negative for shortness of breath.   Cardiovascular:  Negative for chest pain and palpitations.  Psychiatric/Behavioral:  Positive for dysphoric mood and sleep  disturbance. Negative for self-injury and suicidal ideas. The patient is nervous/anxious.     Patient Active Problem List   Diagnosis Date Noted   Class 3 severe obesity with serious comorbidity and body mass index (BMI) of 45.0 to 49.9 in adult 04/08/2022   Tonsillar hypertrophy 04/08/2022    No Known Allergies  Past Surgical History:  Procedure Laterality Date   CHOLECYSTECTOMY N/A 11/10/2016   Procedure: LAPAROSCOPIC CHOLECYSTECTOMY;  Surgeon: Florene Glen, MD;  Location: [facility] ORS;  Service: General;  Laterality: N/A;    Social History   Tobacco Use   Smoking status: Former    Packs/day:  1.00    Years: 2.00    Additional pack years: 0.00    Total pack years: 2.00    Types: Cigarettes    Quit date: 07/11/2019    Years since quitting: 2.7   Smokeless tobacco: Never   Tobacco comments:    1 PACK WILL LAST HER A WEEK  Vaping Use   Vaping Use: Never used  Substance Use Topics   Alcohol use: No   Drug use: No     Medication list has been reviewed and updated.  Current Meds  Medication Sig   etonogestrel (NEXPLANON) 68 MG IMPL implant 1 each by Subdermal route once.   FLUoxetine (PROZAC) 20 MG tablet Take 1 tablet (20 mg total) by mouth daily.       04/08/2022    1:59 PM  GAD 7 : Generalized Anxiety Score  Nervous, Anxious, on Edge 0  Control/stop worrying 3  Worry too much - different things 3  Trouble relaxing 2  Restless 3  Easily annoyed or irritable 3  Afraid - awful might happen 3  Total GAD 7 Score 17  Anxiety Difficulty Extremely difficult       04/08/2022    1:59 PM  Depression screen PHQ 2/9  Decreased Interest 3  Down, Depressed, Hopeless 3  PHQ - 2 Score 6  Altered sleeping 3  Tired, decreased energy 3  Change in appetite 2  Feeling bad or failure about yourself  3  Trouble concentrating 1  Moving slowly or fidgety/restless 3  Suicidal thoughts 0  PHQ-9 Score 21  Difficult doing work/chores Extremely dIfficult      04/08/2022    1:50 PM  Results of the Epworth flowsheet  Sitting and reading 3  Watching TV 3  Sitting, inactive in a public place (e.g. a theatre or a meeting) 0  As a passenger in a car for an hour without a break 3  Lying down to rest in the afternoon when circumstances permit 3  Sitting and talking to someone 1  Sitting quietly after a lunch without alcohol 2  In a car, while stopped for a few minutes in traffic 0  Total score 15    BP Readings from Last 3 Encounters:  04/08/22 (!) 128/90  10/26/21 (!) 149/84  09/04/20 115/70    Wt Readings from Last 3 Encounters:  04/08/22 263 lb (119.3 kg)  10/26/21 205 lb  (93 kg)  09/04/20 250 lb (113.4 kg)    BP (!) 128/90   Pulse 74   Temp 98.1 F (36.7 C) (Oral)   Ht 5\' 4"  (1.626 m)   Wt 263 lb (119.3 kg)   SpO2 97%   BMI 45.14 kg/m   Physical Exam Vitals and nursing note reviewed.  Constitutional:      Appearance: Normal appearance.  HENT:     Mouth/Throat:  Pharynx: No oropharyngeal exudate or posterior oropharyngeal erythema.     Tonsils: 4+ on the right. 3+ on the left.     Comments: Tonsillar hypertrophy apparent, uvula midline Eyes:     Comments: Palpebral conjunctiva moderately injected bilaterally  Cardiovascular:     Rate and Rhythm: Normal rate and regular rhythm.     Heart sounds: Normal heart sounds.  Pulmonary:     Effort: Pulmonary effort is normal.     Breath sounds: Normal breath sounds.  Psychiatric:        Mood and Affect: Mood is depressed. Affect is tearful.        Behavior: Behavior normal.        Thought Content: Thought content does not include suicidal ideation.      Assessment and Plan:  1. Current severe episode of major depressive disorder without psychotic features without prior episode Discussed pros and cons of pharmacotherapy with patient.  Through shared decision making, we have decided to start fluoxetine, which was chosen for its weight neutral profile.  Explained that it may take 1 to 2 weeks to see any benefit from this medication.  Will hold off on behavioral health referral at this time while we gauge medication response - FLUoxetine (PROZAC) 20 MG tablet; Take 1 tablet (20 mg total) by mouth daily.  Dispense: 30 tablet; Refill: 1  2. Anxiety and depression Plan as above  3. Hypertension, unspecified type Elevated in clinic today x 2.  She is fairly tearful/upset at this initial visit which could be contributing.  No medication for now, continue to monitor.  Will reevaluate at next visit in 1 month  4. Witnessed episode of apnea Based on history and physical, I feel it is very likely that  she has OSA.  Sending for sleep study - Ambulatory referral to Sleep Studies  5. Other fatigue Check baseline labs, likely multifactorial between depression and OSA - CBC with Differential/Platelet - Comprehensive metabolic panel - TSH - VITAMIN D 25 Hydroxy (Vit-D Deficiency, Fractures) - Ambulatory referral to Sleep Studies  6. Encounter for vitamin deficiency screening Check vitamin D - VITAMIN D 25 Hydroxy (Vit-D Deficiency, Fractures)  7. Screening for diabetes mellitus Check A1c given her history of prediabetes with no recent A1c on file in the last few years - Hemoglobin A1c  8. Anosmia Symptoms preceded COVID-19 pandemic, patient has not had COVID to her knowledge.  Refer to ENT for further evaluation - Ambulatory referral to ENT  9. Tonsillar hypertrophy Enlarged tonsils likely contributing to OSA.  Refer for ENT consult and consideration of tonsillectomy - Ambulatory referral to Sleep Studies - Ambulatory referral to ENT  10. Class 3 severe obesity with serious comorbidity and body mass index (BMI) of 45.0 to 49.9 in adult, unspecified obesity type Plan to discuss this further at next visit.  Excess weight likely contributory to snoring - CBC with Differential/Platelet - Comprehensive metabolic panel - TSH - Ambulatory referral to Sleep Studies  11. History of prediabetes Checking A1c today    Return in about 1 month (around 05/08/2022) for nonfasting OV dep, weight.   Partially dictated using Editor, commissioning. Any errors are unintentional.  Lupita Leash, PA-C, DMSc, [place] [phone]

## 2022-04-08 NOTE — Telephone Encounter (Signed)
Pt reports that her pharmacy informed her that her insurance will not cover the FLUoxetine (PROZAC) 20 MG tablet but they will cover it in the form of a capsule. Pt requests that the Rx be sent to her as a capsule so the insurance will cover.

## 2022-04-09 ENCOUNTER — Other Ambulatory Visit: Payer: Self-pay | Admitting: Physician Assistant

## 2022-04-09 ENCOUNTER — Other Ambulatory Visit: Payer: Self-pay

## 2022-04-09 DIAGNOSIS — F322 Major depressive disorder, single episode, severe without psychotic features: Secondary | ICD-10-CM

## 2022-04-09 DIAGNOSIS — E559 Vitamin D deficiency, unspecified: Secondary | ICD-10-CM | POA: Insufficient documentation

## 2022-04-09 LAB — COMPREHENSIVE METABOLIC PANEL
ALT: 63 IU/L — ABNORMAL HIGH (ref 0–32)
AST: 38 IU/L (ref 0–40)
Albumin/Globulin Ratio: 1.6 (ref 1.2–2.2)
Albumin: 4.5 g/dL (ref 4.0–5.0)
Alkaline Phosphatase: 70 IU/L (ref 44–121)
BUN/Creatinine Ratio: 12 (ref 9–23)
BUN: 12 mg/dL (ref 6–20)
Bilirubin Total: 0.4 mg/dL (ref 0.0–1.2)
CO2: 24 mmol/L (ref 20–29)
Calcium: 9.9 mg/dL (ref 8.7–10.2)
Chloride: 99 mmol/L (ref 96–106)
Creatinine, Ser: 0.97 mg/dL (ref 0.57–1.00)
Globulin, Total: 2.8 g/dL (ref 1.5–4.5)
Glucose: 87 mg/dL (ref 70–99)
Potassium: 4.5 mmol/L (ref 3.5–5.2)
Sodium: 138 mmol/L (ref 134–144)
Total Protein: 7.3 g/dL (ref 6.0–8.5)
eGFR: 82 mL/min/{1.73_m2} (ref >59)

## 2022-04-09 LAB — VITAMIN D 25 HYDROXY (VIT D DEFICIENCY, FRACTURES): Vit D, 25-Hydroxy: 9.7 ng/mL — ABNORMAL LOW (ref 30.0–100.0)

## 2022-04-09 LAB — CBC WITH DIFFERENTIAL/PLATELET
Basophils Absolute: 0.1 10*3/uL (ref 0.0–0.2)
Basos: 1 %
EOS (ABSOLUTE): 0.1 10*3/uL (ref 0.0–0.4)
Eos: 1 %
Hematocrit: 41.9 % (ref 34.0–46.6)
Hemoglobin: 14.2 g/dL (ref 11.1–15.9)
Immature Grans (Abs): 0 10*3/uL (ref 0.0–0.1)
Immature Granulocytes: 0 %
Lymphocytes Absolute: 2.2 10*3/uL (ref 0.7–3.1)
Lymphs: 23 %
MCH: 30.3 pg (ref 26.6–33.0)
MCHC: 33.9 g/dL (ref 31.5–35.7)
MCV: 90 fL (ref 79–97)
Monocytes Absolute: 0.7 10*3/uL (ref 0.1–0.9)
Monocytes: 7 %
Neutrophils Absolute: 6.7 10*3/uL (ref 1.4–7.0)
Neutrophils: 68 %
Platelets: 325 10*3/uL (ref 150–450)
RBC: 4.68 x10E6/uL (ref 3.77–5.28)
RDW: 12.8 % (ref 11.7–15.4)
WBC: 9.8 10*3/uL (ref 3.4–10.8)

## 2022-04-09 LAB — TSH: TSH: 2.9 u[IU]/mL (ref 0.450–4.500)

## 2022-04-09 LAB — HEMOGLOBIN A1C
Est. average glucose Bld gHb Est-mCnc: 111 mg/dL
Hgb A1c MFr Bld: 5.5 % (ref 4.8–5.6)

## 2022-04-09 MED ORDER — CHOLECALCIFEROL 1.25 MG (50000 UT) PO CAPS
50000.0000 [IU] | ORAL_CAPSULE | ORAL | 0 refills | Status: AC
Start: 2022-04-09 — End: 2022-06-26

## 2022-04-09 MED ORDER — FLUOXETINE HCL 20 MG PO CAPS
20.0000 mg | ORAL_CAPSULE | Freq: Every day | ORAL | 1 refills | Status: DC
Start: 2022-04-09 — End: 2022-05-11

## 2022-04-09 NOTE — Telephone Encounter (Signed)
Requested medication (s) are due for refill today:   An alternative is being requested  Requested medication (s) are on the active medication list:   Yes  Future visit scheduled:   Yes 05/08/2022 with Lupita Leash, PA   Last ordered: 04/08/2022 #30, 1 refill  Returned because an alternative is being requested.   Requested Prescriptions  Pending Prescriptions Disp Refills   FLUoxetine (PROZAC) 20 MG capsule [Pharmacy Med Name: FLUOXETINE HCL 20 MG [place]  0     Psychiatry:  Antidepressants - SSRI Passed - 04/08/2022  3:08 PM      Passed - Completed PHQ-2 or PHQ-9 in the last 360 days      Passed - Valid encounter within last 6 months    Recent Outpatient Visits           Yesterday Current severe episode of major depressive disorder without psychotic features without prior episode   [facility] [place] [facility], Melene Plan, PA       Future Appointments             In 4 weeks Saverio Danker, Melene Plan, [place] [facility], [facility]

## 2022-04-09 NOTE — Telephone Encounter (Signed)
Sent in Capsules.  KP

## 2022-05-08 ENCOUNTER — Ambulatory Visit: Payer: Medicaid Other | Admitting: Physician Assistant

## 2022-05-11 ENCOUNTER — Ambulatory Visit (INDEPENDENT_AMBULATORY_CARE_PROVIDER_SITE_OTHER): Payer: Medicaid Other | Admitting: Physician Assistant

## 2022-05-11 ENCOUNTER — Encounter: Payer: Self-pay | Admitting: Physician Assistant

## 2022-05-11 VITALS — BP 112/72 | HR 83 | Temp 98.0°F | Ht 64.0 in | Wt 257.0 lb

## 2022-05-11 DIAGNOSIS — F322 Major depressive disorder, single episode, severe without psychotic features: Secondary | ICD-10-CM

## 2022-05-11 DIAGNOSIS — E559 Vitamin D deficiency, unspecified: Secondary | ICD-10-CM

## 2022-05-11 DIAGNOSIS — R0681 Apnea, not elsewhere classified: Secondary | ICD-10-CM

## 2022-05-11 DIAGNOSIS — Z6841 Body Mass Index (BMI) 40.0 and over, adult: Secondary | ICD-10-CM

## 2022-05-11 DIAGNOSIS — J351 Hypertrophy of tonsils: Secondary | ICD-10-CM | POA: Diagnosis not present

## 2022-05-11 MED ORDER — FLUOXETINE HCL 20 MG PO CAPS
20.0000 mg | ORAL_CAPSULE | Freq: Every day | ORAL | 1 refills | Status: AC
Start: 2022-05-11 — End: ?

## 2022-05-11 NOTE — Progress Notes (Signed)
Date:  05/11/2022   Name:  Alicia Weaver   DOB:  12-04-1995   MRN:  [id number]   Chief Complaint: Depression and Weight Check (Down 6 pounds )  HPI Alicia Weaver returns today for 1 month follow-up on depression and obesity.  She has been doing much better since starting fluoxetine and would like to continue at this dose.  Also discovered pronounced vitamin D deficiency last visit, and started on high-dose weekly vitamin D supplementation which she is taking with good compliance and no side effects.    Regarding obesity, she is not checking weight at home but our scale shows 6 pound weight loss since last visit.  Patient does not consume many vegetables.  Endorses sweet tea consumption 2 to 3 glasses/day, but she makes at home with about 1-1/3 cups of sugar per gallon of tea.  Otherwise she is drinking water.  Eats fast food occasionally, less than once per week on average.  Physical activity is sparse.  Sleep is disrupted, suspecting OSA, sleep study next week.   Medication list has been reviewed and updated.  Current Meds  Medication Sig   Cholecalciferol 1.25 MG (50000 UT) capsule Take 1 capsule (50,000 Units total) by mouth once a week for 12 doses.   etonogestrel (NEXPLANON) 68 MG IMPL implant 1 each by Subdermal route once.   [DISCONTINUED] FLUoxetine (PROZAC) 20 MG capsule Take 1 capsule (20 mg total) by mouth daily.     Review of Systems  Constitutional:  Negative for fatigue and fever.  Respiratory:  Negative for chest tightness and shortness of breath.   Cardiovascular:  Negative for chest pain and palpitations.  Gastrointestinal:  Negative for abdominal pain.  Psychiatric/Behavioral:  Positive for sleep disturbance. Negative for dysphoric mood. The patient is not nervous/anxious.     Patient Active Problem List   Diagnosis Date Noted   Vitamin D deficiency 04/09/2022   Class 3 severe obesity with serious comorbidity and body mass index (BMI) of 45.0 to 49.9 in adult [facility])  04/08/2022   Tonsillar hypertrophy 04/08/2022   Anosmia 04/08/2022   Hypertension 04/08/2022   Current severe episode of major depressive disorder without psychotic features without prior episode (HCC) 04/08/2022   Anxiety and depression 04/08/2022    No Known Allergies  Immunization History  Administered Date(s) Administered   Influenza Split 11/23/2013   Tdap 01/22/2014    Past Surgical History:  Procedure Laterality Date   CHOLECYSTECTOMY N/A 11/10/2016   Procedure: LAPAROSCOPIC CHOLECYSTECTOMY;  Surgeon: Lattie Haw, MD;  Location: [facility] ORS;  Service: General;  Laterality: N/A;    Social History   Tobacco Use   Smoking status: Former    Packs/day: 1.00    Years: 2.00    Additional pack years: 0.00    Total pack years: 2.00    Types: Cigarettes    Quit date: 07/11/2019    Years since quitting: 2.8   Smokeless tobacco: Never   Tobacco comments:    1 PACK WILL LAST HER A WEEK  Vaping Use   Vaping Use: Never used  Substance Use Topics   Alcohol use: No   Drug use: No       05/11/2022    3:56 PM 04/08/2022    1:59 PM  GAD 7 : Generalized Anxiety Score  Nervous, Anxious, on Edge 0 0  Control/stop worrying 0 3  Worry too much - different things 0 3  Trouble relaxing 0 2  Restless 0 3  Easily annoyed or  irritable 0 3  Afraid - awful might happen 0 3  Total GAD 7 Score 0 17  Anxiety Difficulty Not difficult at all Extremely difficult       05/11/2022    3:56 PM 04/08/2022    1:59 PM  Depression screen PHQ 2/9  Decreased Interest 0 3  Down, Depressed, Hopeless 0 3  PHQ - 2 Score 0 6  Altered sleeping 0 3  Tired, decreased energy 0 3  Change in appetite 0 2  Feeling bad or failure about yourself  1 3  Trouble concentrating 0 1  Moving slowly or fidgety/restless 0 3  Suicidal thoughts 0 0  PHQ-9 Score 1 21  Difficult doing work/chores Not difficult at all Extremely dIfficult    BP Readings from Last 3 Encounters:  05/11/22 112/72  04/08/22 (!) 128/90   10/26/21 (!) 149/84    Wt Readings from Last 3 Encounters:  05/11/22 257 lb (116.6 kg)  04/08/22 263 lb (119.3 kg)  10/26/21 205 lb (93 kg)    BP 112/72   Pulse 83   Temp 98 F (36.7 C) (Oral)   Ht 5\' 4"  (1.626 m)   Wt 257 lb (116.6 kg)   SpO2 98%   BMI 44.11 kg/m   Physical Exam Vitals and nursing note reviewed.  Constitutional:      Appearance: Normal appearance.  Cardiovascular:     Rate and Rhythm: Normal rate and regular rhythm.     Heart sounds: No murmur heard.    No friction rub. No gallop.  Pulmonary:     Effort: Pulmonary effort is normal.     Breath sounds: Normal breath sounds.  Abdominal:     General: There is no distension.  Musculoskeletal:        General: Normal range of motion.  Skin:    General: Skin is warm and dry.  Neurological:     Mental Status: She is alert and oriented to person, place, and time.     Gait: Gait is intact.  Psychiatric:        Mood and Affect: Mood and affect normal.     Recent Labs     Component Value Date/Time   NA 138 04/08/2022 1435   NA 136 03/29/2014 1245   K 4.5 04/08/2022 1435   K 4.3 03/29/2014 1245   CL 99 04/08/2022 1435   CL 105 03/29/2014 1245   CO2 24 04/08/2022 1435   CO2 23 03/29/2014 1245   GLUCOSE 87 04/08/2022 1435   GLUCOSE 78 11/09/2016 1011   GLUCOSE 75 03/29/2014 1245   BUN 12 04/08/2022 1435   BUN 9 03/29/2014 1245   CREATININE 0.97 04/08/2022 1435   CREATININE 0.52 03/29/2014 1245   CALCIUM 9.9 04/08/2022 1435   CALCIUM 9.0 03/29/2014 1245   PROT 7.3 04/08/2022 1435   PROT 7.2 10/07/2013 0324   ALBUMIN 4.5 04/08/2022 1435   ALBUMIN 3.6 (L) 10/07/2013 0324   AST 38 04/08/2022 1435   AST 17 03/29/2014 1245   ALT 63 (H) 04/08/2022 1435   ALT 60 10/07/2013 0324   ALKPHOS 70 04/08/2022 1435   ALKPHOS 68 10/07/2013 0324   BILITOT 0.4 04/08/2022 1435   BILITOT 0.2 10/07/2013 0324   GFRNONAA >60 11/09/2016 1011   GFRNONAA >60 03/29/2014 1245   GFRAA >60 11/09/2016 1011   GFRAA  >60 03/29/2014 1245    Lab Results  Component Value Date   WBC 9.8 04/08/2022   HGB 14.2 04/08/2022   HCT 41.9  04/08/2022   MCV 90 04/08/2022   PLT 325 04/08/2022   Lab Results  Component Value Date   HGBA1C 5.5 04/08/2022   No results found for: "CHOL", "HDL", "LDLCALC", "LDLDIRECT", "TRIG", "CHOLHDL" Lab Results  Component Value Date   TSH 2.900 04/08/2022     Assessment and Plan:  1. Current severe episode of major depressive disorder without psychotic features without prior episode (HCC) Much improved since starting fluoxetine, sending 90-day supply with 1 refill. - FLUoxetine (PROZAC) 20 MG capsule; Take 1 capsule (20 mg total) by mouth daily.  Dispense: 90 capsule; Refill: 1  2. Class 3 severe obesity with serious comorbidity and body mass index (BMI) of 45.0 to 49.9 in adult, unspecified obesity type (HCC) 6 pound intentional weight loss since last visit, congratulated on this. Recommend diet low in simple carbohydrates such as white starches (bread, pasta, rice) and refined sugar found in desserts and sweetened beverages including juice, sweet tea, and soda.   Recommend routine physical activity which can start small (10 min daily walk) with eventual goal of 150 min of heart rate elevation per week. Examples could include brisk walk, stationary bike, etc. At least twice per week, I recommend some type of resistance training which could be weights, resistance bands, or body weight exercises - this would count toward the weekly goal.  Goals set today: At least 1 serving of vegetables daily. Patient enjoys corn, carrots, and green beans At least 1-2 servings of fruit daily Limit sweet tea intake to max 2 glasses per day At least 3 periods of exercise weekly lasting 10-20 min each. Patient enjoys walking and [place].   3. Tonsillar hypertrophy Referral submitted 04/08/22 for [facility], but patient has not heard back.  Patient given contact info to  follow-up; she will call tomorrow.  4. Vitamin D deficiency Continue current Vit D supplementation.  Education provided and attached to visit today.  Plan to recheck in 2 months  5. Witnessed episode of apnea Sleep study is scheduled, encouraged follow-up with specialist.  We will be on the look out for results.  Discussed role of sleep in weight loss and metabolism    Return in about 2 months (around 07/11/2022) for OV Vit D, dep.   Partially dictated using Animal nutritionist. Any errors are unintentional.  Alvester Morin, PA-C, DMSc, Nutritionist [facility] [phone]

## 2022-05-11 NOTE — Patient Instructions (Addendum)
-  It was a pleasure to see you today! Please review your visit summary for helpful information -I would encourage you to follow your care via MyChart where you can access lab results, notes, messages, and more -If you feel that we did a nice job today, please complete your after-visit survey and leave [place] a Google review! Your CMA today was Mariann Barter and your provider was Alvester Morin, PA-C, DMSc -Please return for follow-up in about 2 months

## 2022-05-13 ENCOUNTER — Telehealth: Payer: Self-pay | Admitting: Physician Assistant

## 2022-05-13 NOTE — Telephone Encounter (Signed)
Copied from CRM [id number]. Topic: Referral - Request for Referral >> May 13, 2022  9:53 AM Clide Dales wrote: Patient states that she saw [facility] today and patient was not satisfied with the results of the visit. Patient would like to be referred to a different ENT for a second opinion. Please advise.

## 2022-05-14 ENCOUNTER — Other Ambulatory Visit: Payer: Self-pay

## 2022-05-14 DIAGNOSIS — J351 Hypertrophy of tonsils: Secondary | ICD-10-CM

## 2022-05-14 DIAGNOSIS — R430 Anosmia: Secondary | ICD-10-CM

## 2022-05-14 NOTE — Telephone Encounter (Signed)
Pt called, advised of another ENT referral placed. Pt states she doesn't want to go back to [facility] d/t she wasn't treated properly. She was in and off of visit in less time than it took for her to check in and sign new pt forms. Advised that referral team will review and give her a call with new ENT. No further assistance noted.

## 2022-05-14 NOTE — Telephone Encounter (Signed)
*  pt is not aware

## 2022-05-14 NOTE — Telephone Encounter (Signed)
Called pt got disconnected X2. Another referral has been placed for a 2nd opinion. Pt aware.  KP

## 2022-05-14 NOTE — Telephone Encounter (Signed)
Please review. Can a new referral be placed?  KP

## 2022-05-22 ENCOUNTER — Encounter: Payer: Self-pay | Admitting: Physician Assistant

## 2022-05-22 DIAGNOSIS — G4733 Obstructive sleep apnea (adult) (pediatric): Secondary | ICD-10-CM | POA: Insufficient documentation

## 2022-07-08 ENCOUNTER — Telehealth: Payer: Self-pay | Admitting: Physician Assistant

## 2022-07-08 NOTE — Telephone Encounter (Signed)
Spoke to patient and gave her providers message. Patient stated that she did the pressure test about 2 weeks ago and wanted to know if her PCP has received those results yet. Advised I would forward message to PCP.

## 2022-07-08 NOTE — Telephone Encounter (Signed)
Called pt could not VM to call back. VM full.   Please read pt the other telephone notes from when the pt called in earlier.  KP

## 2022-07-08 NOTE — Telephone Encounter (Signed)
Pt asked if PCP had received an update on her recent CPAP and stated she had this done sometime last month. Mentioned they usually contact her, but she hasn't heard back, so she was wondering if PCP received an update.  Please advise.

## 2022-07-08 NOTE — Telephone Encounter (Signed)
Called pt could not reach patient. Something happens when you call if rings then hangs up. Could not leave VM. Dan sent a Mychart message on 05/22/2022. His message is listed below:   Hi Alicia Weaver!  "I just gave you a courtesy call to make sure you got your sleep study results. Something odd was happening with the phone so I was unable to leave a message. As suspected, you do have sleep apnea. It sounds like they are going to do another test to figure out what amount of pressure you might need for CPAP if you choose to proceed. Just wanted to let you know. Happy to answer any questions."  Pt needs to call [phone] to schedule.  Have a [place],      [place]

## 2022-07-08 NOTE — Telephone Encounter (Signed)
Copied from CRM [id number]. Topic: General - Call Back - No Documentation >> Jul 08, 2022  3:25 PM Ja-Kwan M wrote: Reason for CRM: Pt reports that she had a missed call from the office so she was returning the call. Cb# [phone]

## 2022-07-10 NOTE — Telephone Encounter (Signed)
Spoke to pt let her know that we have received all information that needed. Let pt know that Jesusita Oka is out of the office and will return on Monday. Pt verbalized understanding. Pt asked if medicaid will cover the equipment. Told pt that I'm not sure that the sleep study place will cntact her with more information.  KP

## 2022-07-15 ENCOUNTER — Ambulatory Visit: Payer: Medicaid Other | Admitting: Physician Assistant

## 2022-07-31 ENCOUNTER — Ambulatory Visit: Payer: Medicaid Other | Admitting: Physician Assistant

## 2022-08-03 ENCOUNTER — Telehealth: Payer: Self-pay

## 2022-08-03 NOTE — Transitions of Care (Post Inpatient/ED Visit) (Signed)
   08/03/2022  Name: RAIZA FOELL MRN: [id number] DOB: [birth year]  Today's TOC FU Call Status: Today's TOC FU Call Status:: Unsuccessul Call (1st Attempt) Unsuccessful Call (1st Attempt) Date: 08/03/22  Attempted to reach the patient regarding the most recent Inpatient/ED visit.  Follow Up Plan: Additional outreach attempts will be made to reach the patient to complete the Transitions of Care (Post Inpatient/ED visit) call.   [employer], CMA

## 2022-08-04 NOTE — Transitions of Care (Post Inpatient/ED Visit) (Signed)
   08/04/2022  Name: Alicia Weaver MRN: [id number] DOB: [birth year]  Today's TOC FU Call Status: Today's TOC FU Call Status:: Successful TOC FU Call Competed Unsuccessful Call (1st Attempt) Date: 08/03/22 [facility] FU Call Complete Date: 08/04/22  Transition Care Management Follow-up Telephone Call Date of Discharge: 08/03/22 Discharge Facility: Other ([facility]) Name of Other ([facility]) Discharge Facility: [facility] Type of Discharge: Emergency Department Reason for ED Visit:  (Sprain of unspecified site of left knee) How have you been since you were released from the hospital?: Same Any questions or concerns?: No  Items Reviewed: Did you receive and understand the discharge instructions provided?: Yes Medications obtained,verified, and reconciled?: Yes (Medications Reviewed) Any new allergies since your discharge?: No Dietary orders reviewed?: No Do you have support at home?: No  Medications Reviewed Today: Medications Reviewed Today   Medications were not reviewed in this encounter     Home Care and Equipment/Supplies: Were Home Health Services Ordered?: No Any new equipment or medical supplies ordered?: No  Functional Questionnaire: Do you need assistance with bathing/showering or dressing?: No Do you need assistance with meal preparation?: No Do you need assistance with eating?: No Do you have difficulty maintaining continence: No Do you need assistance with getting out of bed/getting out of a chair/moving?: No Do you have difficulty managing or taking your medications?: No  Follow up appointments reviewed: PCP Follow-up appointment confirmed?: Yes Follow-up Provider: Othopedics referral was placed [facility] Hospital Follow-up appointment confirmed?: Yes Follow-Up Specialty Provider:: ortho referral placed Do you need transportation to your follow-up appointment?: No Do you understand care options if your condition(s) worsen?: Yes-patient verbalized  understanding    SIGNATUREKieandra Terriyah Westra, CMA

## 2022-08-05 ENCOUNTER — Ambulatory Visit: Payer: Self-pay | Admitting: *Deleted

## 2022-08-05 ENCOUNTER — Other Ambulatory Visit: Payer: Self-pay | Admitting: Physician Assistant

## 2022-08-05 DIAGNOSIS — M25562 Pain in left knee: Secondary | ICD-10-CM

## 2022-08-05 MED ORDER — KETOROLAC TROMETHAMINE 10 MG PO TABS: 10.0000 mg | ORAL_TABLET | Freq: Four times a day (QID) | ORAL | 0 refills | Status: DC | PRN

## 2022-08-05 NOTE — Telephone Encounter (Signed)
Message from Phill Myron sent at 08/05/2022  8:41 AM EDT  Summary: left knee injury pain   Left knee injury, saturday, in pain . went to ER and they did not give her medication she stated she can not take off work, for a hospital follow up,  provider does have openings today, she declined, just want medication          Call History  Contact Date/Time Type Contact Phone/Fax User  08/05/2022 08:36 AM EDT Phone (Incoming) Alicia Weaver (Self) [phone] [address], [place]   Reason for Disposition  [1] After 3 days AND [2] pain not improved    Seen in ED 08/03/2022 for left knee injury.   Told to take ibuprofen and Tylenol for pain but it's not helping.  Wanting pain medication called in however doesn't want to take time off of work to come in.   I let her know she would need to be seen before pain medication could be prescribed.  Answer Assessment - Initial Assessment Questions 1. MECHANISM: "How did the injury happen?" (e.g., twisting injury, direct blow)      I injured my knee and went to the ED.  (08/03/2022)  They didn't give me any medication for the pain except ibuprofen and Tylenol.  It's not helping very much.   An orthopedic dr is supposed to be calling me.  They put me in a whole leg knee brace and told me to use crutches.  I'm needing something for the pain.  Can someone call in something for me?   I let her know she would need to be seen before any medication could be prescribed. I can't take off any more days from work.  I missed a day of work going to the ED.   I'm going to miss work going to the orthopedic dr when they call and I can't take another day to come see y'all too.   I work standing up all day.  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     2. ONSET: "When did the injury happen?" (Minutes or hours ago)      Last week 3. LOCATION: "Where is the injury located?"      Left knee is swollen and bruised on the side. 4. APPEARANCE of INJURY: "What does  the injury look like?"  (e.g., deformity of leg)     No broken bones on the x ray in the ED 5. SEVERITY: "Can you put weight on that leg?" "Can you walk?"      I can but it hurts.   I'm in a full leg brace where I can't bend my knee and using crutches. 6. SIZE: For cuts, bruises, or swelling, ask: "How large is it?" (e.g., inches or centimeters)      It's swollen and bruised 7. PAIN: "Is there pain?" If Yes, ask: "How bad is the pain?"   "What does it keep you from doing?" (e.g., Scale 1-10; or mild, moderate, severe)   -  NONE: (0): no pain   -  MILD (1-3): doesn't interfere with normal activities    -  MODERATE (4-7): interferes with normal activities (e.g., work or school) or awakens from sleep, limping    -  SEVERE (8-10): excruciating pain, unable to do any normal activities, unable to walk     Yes and the ibuprofen and Tylenol are not helping.   I'm using ice as instructed but it hurts while standing on  it doing my job. 8. TETANUS: For any breaks in the skin, ask: "When was the last tetanus booster?"     Not asked since seen in ED 9. OTHER SYMPTOMS: "Do you have any other symptoms?"      No 10. PREGNANCY: "Is there any chance you are pregnant?" "When was your last menstrual period?"       Not asked  Protocols used: Leg Injury-A-AH

## 2022-08-05 NOTE — Telephone Encounter (Signed)
Noted  KP 

## 2022-08-05 NOTE — Telephone Encounter (Signed)
Please review. Pt refused appt.  KP

## 2022-08-05 NOTE — Telephone Encounter (Signed)
  Chief Complaint: Left knee is hurting from an injury.   Seen in ED 08/03/2022.  No broken bones.   Requesting pain medication be called in because the ibuprofen and Tylenol prescribed in the ED are not helping with the pain. Refusing appts.   "I can't take any more time off of work".   "I'll wait for the orthopedic dr to call me".  Symptoms: Left knee is bruised, painful and swollen from an injury to it last week.   X ray in ED did not reveal a broken bone.   An orthopedic dr is supposed to be calling her.  She is in a full leg brace that prevents her from bending her knee and is on crutches.    Frequency: daily Pertinent Negatives: Patient denies that the ibuprofen and Tylenol are helping with the pain.   Disposition: [] ED /[] Urgent Care (no appt availability in office) / [x] Appointment(In office/virtual)/ []  [place] [facility]/ [] Home Care/ [] Refused Recommended Disposition /[] [place] [facility]/ []  Follow-up with PCP Additional Notes: Refusing appts stating she can't take any more time off of work.   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  I let pt know pain medication could not be called in without her being seen and evaluated by the provider.   She's decided to wait until the orthopedic doctor calls her.

## 2023-05-07 ENCOUNTER — Telehealth: Payer: Self-pay | Admitting: Physician Assistant

## 2023-05-07 NOTE — Telephone Encounter (Signed)
 Spoke with patient via phone.  I received a form regarding medical necessity for CPAP/BiPAP supplies.  Patient has equipment, but is trying to customize it for comfort.  Will sign and send forms.  Patient reminded he has been nearly 1 year since her last office visit, encouraged to use MyChart to schedule a follow-up appointment soon.

## 2023-06-24 ENCOUNTER — Ambulatory Visit: Admitting: Physician Assistant

## 2023-07-06 ENCOUNTER — Encounter: Payer: Self-pay | Admitting: Emergency Medicine

## 2023-07-06 ENCOUNTER — Other Ambulatory Visit: Payer: Self-pay

## 2023-07-06 ENCOUNTER — Telehealth: Payer: Self-pay

## 2023-07-06 ENCOUNTER — Emergency Department
Admission: EM | Admit: 2023-07-06 | Discharge: 2023-07-06 | Disposition: A | Discharge status: Home / Self Care | Attending: Emergency Medicine | Admitting: Emergency Medicine

## 2023-07-06 DIAGNOSIS — K0889 Other specified disorders of teeth and supporting structures: Secondary | ICD-10-CM | POA: Insufficient documentation

## 2023-07-06 DIAGNOSIS — I10 Essential (primary) hypertension: Secondary | ICD-10-CM | POA: Diagnosis not present

## 2023-07-06 MED ORDER — AMOXICILLIN 500 MG PO TABS: 500.0000 mg | ORAL_TABLET | Freq: Three times a day (TID) | ORAL | 0 refills | Status: AC

## 2023-07-06 NOTE — ED Provider Notes (Signed)
 [facility] Provider Note    Event Date/Time   First MD Initiated Contact with Patient 07/06/23 0901     (approximate)   History   Dental Pain   HPI  Alicia Weaver is a 28 y.o. female with PMH of obesity, hypertension, anxiety, depression, OSA presents for evaluation of dental pain.  Patient states that her pain began last night.  She took some Tylenol  and Aleve  without much relief.  She has not had any fevers or facial swelling.  Patient reports she has a fractured back molar and has been scheduled with her dentist to have it extracted the end of August.      Physical Exam   Triage Vital Signs: ED Triage Vitals [07/06/23 0859]  Encounter Vitals Group     BP 131/75     Girls Systolic BP Percentile      Girls Diastolic BP Percentile      Boys Systolic BP Percentile      Boys Diastolic BP Percentile      Pulse Rate 87     Resp 17     Temp 97.9 F (36.6 C)     Temp Source Oral     SpO2 99 %     Weight 275 lb (124.7 kg)     Height 5' 4 (1.626 m)     Head Circumference      Peak Flow      Pain Score 7     Pain Loc      Pain Education      Exclude from Growth Chart     Most recent vital signs: Vitals:   07/06/23 0859  BP: 131/75  Pulse: 87  Resp: 17  Temp: 97.9 F (36.6 C)  SpO2: 99%   General: Awake, no distress.  CV:  Good peripheral perfusion.  Resp:  Normal effort. Abd:  No distention.  Other:  Oral mucous membranes are moist, last molar in the left upper jaw is broken, no fluctuance noted around the tooth, no facial swelling seen   ED Results / Procedures / Treatments   Labs (all labs ordered are listed, but only abnormal results are displayed) Labs Reviewed - No data to display    PROCEDURES:  Critical Care performed: No  Procedures   MEDICATIONS ORDERED IN ED: Medications - No data to display   IMPRESSION / MDM / ASSESSMENT AND PLAN / ED COURSE  I reviewed the triage vital signs and the nursing notes.                              28 year old female presents for evaluation of dental pain.  Vital signs are stable patient NAD on exam.  Differential diagnosis includes, but is not limited to, dental fracture, dental infection, dental abscess.  Patient's presentation is most consistent with acute, uncomplicated illness.  Patient does have a fractured last molar in the left upper jaw that was tender to percussion.  Did not palpate any fluctuance so do not think patient has an abscess at this time, but do suspect infection as the cause of her pain as the tooth has been broken for a while.  Will get her started on oral antibiotics.  We discussed over-the-counter pain control using Tylenol  and Aleve  as well as Orajel.  Patient was given a note for work.  Discussed following up with her dentist.  She voiced understanding, questions were answered she stable at discharge.  FINAL CLINICAL IMPRESSION(S) / ED DIAGNOSES   Final diagnoses:  Pain, dental     Rx / DC Orders   ED Discharge Orders          Ordered    amoxicillin  (AMOXIL ) 500 MG tablet  3 times daily        07/06/23 9076             Note:  This document was prepared using Dragon voice recognition software and may include unintentional dictation errors.   Cleaster Tinnie LABOR, PA-C 07/06/23 9075    Levander Slate, MD 07/06/23 [phone]

## 2023-07-06 NOTE — Discharge Instructions (Signed)
 Please schedule follow-up appointment with your dentist.  Take the antibiotics as prescribed.  You can take 650 mg of Tylenol  and 600 mg of ibuprofen  every 6 hours as needed for pain. You can use ice, heat, muscle creams and other topical pain relievers as well.

## 2023-07-06 NOTE — Transitions of Care (Post Inpatient/ED Visit) (Unsigned)
   07/06/2023  Name: Alicia Weaver MRN: [id number] DOB: Jun 10, 1995  Today's TOC FU Call Status: Today's TOC FU Call Status:: Unsuccessful Call (1st Attempt)  Attempted to reach the patient regarding the most recent Inpatient/ED visit.  Follow Up Plan: Additional outreach attempts will be made to reach the patient to complete the Transitions of Care (Post Inpatient/ED visit) call.   [employer], CMA

## 2023-07-06 NOTE — ED Triage Notes (Signed)
 Patient to ED via POV for left sided dental pain. Pain started last night. PT reports that she believes that it is her wisdom teeth. States unable to get in with dentist until the end of July.

## 2023-07-07 NOTE — Transitions of Care (Post Inpatient/ED Visit) (Signed)
   07/07/2023  Name: Alicia Weaver MRN: [id number] DOB: 05/14/1995  Today's TOC FU Call Status: Today's TOC FU Call Status:: Successful TOC FU Call Completed TOC FU Call Complete Date: 07/07/23 Patient's Name and Date of Birth confirmed.  Transition Care Management Follow-up Telephone Call Date of Discharge: 07/06/23 Discharge Facility: [facility]) Type of Discharge: Emergency Department Reason for ED Visit: Other: (tooth pain) How have you been since you were released from the hospital?: Better Any questions or concerns?: No  Items Reviewed: Did you receive and understand the discharge instructions provided?: Yes Medications obtained,verified, and reconciled?: Yes (Medications Reviewed) Any new allergies since your discharge?: No Dietary orders reviewed?: NA Do you have support at home?: Yes  Medications Reviewed Today: Medications Reviewed Today   Medications were not reviewed in this encounter     Home Care and Equipment/Supplies: Were Home Health Services Ordered?: NA Any new equipment or medical supplies ordered?: NA  Functional Questionnaire: Do you need assistance with bathing/showering or dressing?: No Do you need assistance with meal preparation?: No Do you need assistance with eating?: No Do you have difficulty maintaining continence: No Do you need assistance with getting out of bed/getting out of a chair/moving?: No Do you have difficulty managing or taking your medications?: No  Follow up appointments reviewed: PCP Follow-up appointment confirmed?: Yes Date of PCP follow-up appointment?: 08/27/23 Follow-up Provider: Dentist [facility] Follow-up appointment confirmed?: NA Do you need transportation to your follow-up appointment?: No Do you understand care options if your condition(s) worsen?: Yes-patient verbalized understanding    SIGNATURE Steva Zayneb Baucum, CMA

## 2023-10-28 ENCOUNTER — Ambulatory Visit
Admission: EM | Admit: 2023-10-28 | Discharge: 2023-10-28 | Disposition: A | Discharge status: Home / Self Care | Attending: Family Medicine | Admitting: Family Medicine

## 2023-10-28 ENCOUNTER — Ambulatory Visit: Payer: Self-pay | Admitting: Family Medicine

## 2023-10-28 ENCOUNTER — Ambulatory Visit (INDEPENDENT_AMBULATORY_CARE_PROVIDER_SITE_OTHER)

## 2023-10-28 ENCOUNTER — Encounter: Payer: Self-pay | Admitting: Family Medicine

## 2023-10-28 DIAGNOSIS — J989 Respiratory disorder, unspecified: Secondary | ICD-10-CM

## 2023-10-28 DIAGNOSIS — R509 Fever, unspecified: Secondary | ICD-10-CM

## 2023-10-28 DIAGNOSIS — Z87891 Personal history of nicotine dependence: Secondary | ICD-10-CM | POA: Diagnosis not present

## 2023-10-28 DIAGNOSIS — J40 Bronchitis, not specified as acute or chronic: Secondary | ICD-10-CM

## 2023-10-28 MED ORDER — BENZONATATE 100 MG PO CAPS: 100.0000 mg | ORAL_CAPSULE | Freq: Three times a day (TID) | ORAL | 0 refills | Status: DC

## 2023-10-28 MED ORDER — AZITHROMYCIN 250 MG PO TABS: ORAL_TABLET | ORAL | 0 refills | Status: DC

## 2023-10-28 NOTE — ED Provider Notes (Addendum)
 [facility]    CSN: [id number] Arrival date & time: 10/28/23  9166      History   Chief Complaint Chief Complaint  Patient presents with   Cough   Fever    HPI Alicia Weaver is a 28 y.o. female.   HPI  History obtained from the patient. Alicia Weaver presents for productive cough withy thick mucus for the past week.  Had sore throat that resolved. Has been taking Zrytec, This morning, she woke with a fever which resolves with Tylenol  and ibuprofen . Tmax 101.3 F this morning.  Took Tylenol  at 8 AM.  No chest tightness, shortness of breath or wheezing. Denies history of smoking or vaping. No history of asthma.  Had some lower back pain this morning.  Her son was diagnosed with rhinovirus and it turned into pneumonia.    She is a former smoker.     Past Medical History:  Diagnosis Date   Cholelithiasis 10/11/2013   Obesity affecting pregnancy 10/11/2013   Overview:  Pt on 81 mg ASA for PIH risk ( MFM) 15 # weight gain recommended    Supervision of normal pregnancy 11/23/2013   Overview:   Factors complicating this pregnancy   Obesity  HGBA1C  10/23/13   5.4 Plan for twice weekly NST BMI 42.   Cholelithiasis  New onset HTN: BP 140/80, 144/88 on repeat. Yesterday. BP 160/90 in the office. PIH labs normal 3/18. Monitored on L&D for 6 hours. BP 105-126/63-78 making me think that 160/90 was erroneous value. Ms. Alicia Weaver's arm is large enough such that our cuff is borderl    Patient Active Problem List   Diagnosis Date Noted   Obstructive sleep apnea 05/22/2022   Vitamin D  deficiency 04/09/2022   Class 3 severe obesity with serious comorbidity and body mass index (BMI) of 45.0 to 49.9 in adult [facility]) 04/08/2022   Tonsillar hypertrophy 04/08/2022   Anosmia 04/08/2022   Hypertension 04/08/2022   Current severe episode of major depressive disorder without psychotic features without prior episode (HCC) 04/08/2022   Anxiety and depression 04/08/2022    Past Surgical  History:  Procedure Laterality Date   CHOLECYSTECTOMY N/A 11/10/2016   Procedure: LAPAROSCOPIC CHOLECYSTECTOMY;  Surgeon: Wonda Charlie BRAVO, MD;  Location: [facility] ORS;  Service: General;  Laterality: N/A;    OB History   No obstetric history on file.      Home Medications    Prior to Admission medications   Medication Sig Start Date End Date Taking? Authorizing Provider  azithromycin  (ZITHROMAX  Z-PAK) 250 MG tablet Take 2 tablets on day 1 then 1 tablet daily 10/28/23  Yes Liddie Chichester, DO  benzonatate (TESSALON) 100 MG capsule Take 1 capsule (100 mg total) by mouth every 8 (eight) hours. 10/28/23  Yes Takiesha Mcdevitt, DO  etonogestrel (NEXPLANON) 68 MG IMPL implant 1 each by Subdermal route once.   Yes [provider]  FLUoxetine  (PROZAC ) 20 MG capsule Take 1 capsule (20 mg total) by mouth daily. 05/11/22  Yes Manya Toribio SQUIBB, PA  ketorolac  (TORADOL ) 10 MG tablet Take 1 tablet (10 mg total) by mouth every 6 (six) hours as needed. Take with food. This medication REPLACES ibuprofen . Do not take with other NSAIDs (ibuprofen , naproxen , aspirin, etc). You may continue using Tylenol  1000 mg every 6 hours. 08/05/22   Manya Toribio SQUIBB, PA    Family History Family History  Problem Relation Age of Onset   Hypertension Mother    Diabetes Mother    Heart disease Mother  Heart attack Mother    Other Father        unknown medical history   Cancer Neg Hx     Social History Social History   Tobacco Use   Smoking status: Former    Current packs/day: 0.00    Average packs/day: 1 pack/day for 2.0 years (2.0 ttl pk-yrs)    Types: Cigarettes    Start date: 07/10/2017    Quit date: 07/11/2019    Years since quitting: 4.3   Smokeless tobacco: Never   Tobacco comments:    1 PACK WILL LAST HER A WEEK  Vaping Use   Vaping status: Never Used  Substance Use Topics   Alcohol use: No   Drug use: No     Allergies   Patient has no known allergies.   Review of Systems Review of  Systems: negative unless otherwise stated in HPI.      Physical Exam Triage Vital Signs ED Triage Vitals  Encounter Vitals Group     BP 10/28/23 0908 (!) 148/78     Girls Systolic BP Percentile --      Girls Diastolic BP Percentile --      Boys Systolic BP Percentile --      Boys Diastolic BP Percentile --      Pulse Rate 10/28/23 0908 93     Resp 10/28/23 0908 18     Temp 10/28/23 0908 98.5 F (36.9 C)     Temp Source 10/28/23 0908 Oral     SpO2 10/28/23 0908 95 %     Weight --      Height --      Head Circumference --      Peak Flow --      Pain Score 10/28/23 0909 0     Pain Loc --      Pain Education --      Exclude from Growth Chart --    No data found.  Updated Vital Signs BP (!) 148/78 (BP Location: Left Arm)   Pulse 93   Temp 98.5 F (36.9 C) (Oral)   Resp 18   SpO2 95%   Visual Acuity Right Eye Distance:   Left Eye Distance:   Bilateral Distance:    Right Eye Near:   Left Eye Near:    Bilateral Near:     Physical Exam GEN:     alert, non-toxic appearing female in no distress     HENT:  mucus membranes moist, no nasal discharge EYES:   no scleral injection or discharge RESP:  no increased work of breathing, difficult to auscultate due to body habitus what heard was clear CVS:   regular rate and rhythm Skin:   warm and dry    UC Treatments / Results  Labs (all labs ordered are listed, but only abnormal results are displayed) Labs Reviewed - No data to display  EKG   Radiology DG Chest 2 View Result Date: 10/28/2023 CLINICAL DATA:  cough with fever, exposure to pneumonia EXAM: CHEST - 2 VIEW COMPARISON:  July 15, 2016 FINDINGS: No focal airspace consolidation, pleural effusion, or pneumothorax. No cardiomegaly.No acute fracture or destructive lesion. IMPRESSION: No acute cardiopulmonary abnormality. Electronically Signed   By: Rogelia Myers M.D.   On: 10/28/2023 09:57     Procedures Procedures (including critical care  time)  Medications Ordered in UC Medications - No data to display  Initial Impression / Assessment and Plan / UC Course  I have reviewed the triage vital signs and  the nursing notes.  Pertinent labs & imaging results that were available during my care of the patient were reviewed by me and considered in my medical decision making (see chart for details).       Pt is a 28 y.o. female who presents for 1 week of cough that is not improving.  Ethelyne is  afebrile here but had recent antipyretics. Satting 95% on room air. Overall pt is  non-toxic appearing, well hydrated, without respiratory distress. Pulmonary exam is unremarkable except for frequent cough.  After shared decision making, we will  pursue chest x-ray at this time.  COVID  and influenza testing deferred due to length of symptoms.   Chest xray personally reviewed by me without focal pneumonia, pleural effusion, cardiomegaly or pneumothorax. Patient aware the radiologist has not read her xray and is comfortable with the preliminary read by me. Will review radiologist read when available and call patient if a change in plan is warranted.  Pt agreeable to this plan prior to discharge.   Treat acute bronchitis with  antibiotics as below.  Tessalon perles given for cough.  Typical duration of symptoms discussed. Return and ED precautions given and patient voiced understanding.   Discussed MDM, treatment plan and plan for follow-up with patient who agrees with plan.    Radiology impression reviewed and no change in plan warranted.   Final Clinical Impressions(s) / UC Diagnoses   Final diagnoses:  Respiratory illness with fever  Former smoker  Bronchitis     Discharge Instructions      Your chest xray did not show evidence of pneumonia though the radiologist has not yet read it. If they find something that I didn't, I will call you.  You will see results in MyChart.  You are being treated with a biotics for lower respiratory  tract infection in the setting of fever.  Stop by the pharmacy to pick up your antibiotics.  Also prescribed you some Tessalon Perles/benzoate capsules to help with your cough.       ED Prescriptions     Medication Sig Dispense Auth. Provider   azithromycin  (ZITHROMAX  Z-PAK) 250 MG tablet Take 2 tablets on day 1 then 1 tablet daily 6 tablet Antoinette Haskett, DO   benzonatate (TESSALON) 100 MG capsule Take 1 capsule (100 mg total) by mouth every 8 (eight) hours. 21 capsule Hanna Aultman, DO      PDMP not reviewed this encounter.       Kriste Berth, DO 10/28/23 1008

## 2023-10-28 NOTE — Discharge Instructions (Signed)
 Your chest xray did not show evidence of pneumonia though the radiologist has not yet read it. If they find something that I didn't, I will call you.  You will see results in MyChart.  You are being treated with a biotics for lower respiratory tract infection in the setting of fever.  Stop by the pharmacy to pick up your antibiotics.  Also prescribed you some Tessalon Perles/benzoate capsules to help with your cough.

## 2023-10-28 NOTE — ED Triage Notes (Signed)
 Cough started last week. Fever started yesterday 101. Denies any vomiting or diarrhea. Home Interventions: Alternating Tylenol  and Motrin .  Son has pneumonia.

## 2023-12-11 ENCOUNTER — Encounter: Payer: Self-pay | Admitting: Emergency Medicine

## 2023-12-11 ENCOUNTER — Ambulatory Visit
Admission: EM | Admit: 2023-12-11 | Discharge: 2023-12-11 | Disposition: A | Discharge status: Home / Self Care | Attending: Physician Assistant | Admitting: Physician Assistant

## 2023-12-11 DIAGNOSIS — Z23 Encounter for immunization: Secondary | ICD-10-CM | POA: Diagnosis not present

## 2023-12-11 DIAGNOSIS — S81802A Unspecified open wound, left lower leg, initial encounter: Secondary | ICD-10-CM | POA: Diagnosis not present

## 2023-12-11 MED ORDER — MUPIROCIN 2 % EX OINT: 1.0000 | TOPICAL_OINTMENT | Freq: Two times a day (BID) | CUTANEOUS | 0 refills | Status: AC

## 2023-12-11 MED ORDER — AMOXICILLIN-POT CLAVULANATE 875-125 MG PO TABS: 1.0000 | ORAL_TABLET | Freq: Two times a day (BID) | ORAL | 0 refills | Status: AC

## 2023-12-11 MED ORDER — TETANUS-DIPHTH-ACELL PERTUSSIS 5-2-15.5 LF-MCG/0.5 IM SUSP
0.5000 mL | Freq: Once | INTRAMUSCULAR | Status: AC
  Administered 2023-12-11: 0.5 mL via INTRAMUSCULAR

## 2023-12-11 NOTE — ED Provider Notes (Signed)
 [facility]    CSN: [id number] Arrival date & time: 12/11/23  1458      History   Chief Complaint Chief Complaint  Patient presents with   Fall    HPI Alicia Weaver is a 28 y.o. female presenting for evaluation of open wound of the left anterior lower leg.  Patient reports she was helping move furniture and fell through a porch 30 minutes prior to arrival.  She has applied a washcloth to the area after it happened and now she has the area covered with gauze and a bandage.  Continues to have some mild bleeding.  No significant or severe pain.  Able to walk and bear weight.  No other injuries.  HPI  Past Medical History:  Diagnosis Date   Cholelithiasis 10/11/2013   Obesity affecting pregnancy 10/11/2013   Overview:  Pt on 81 mg ASA for PIH risk ( MFM) 15 # weight gain recommended    Supervision of normal pregnancy 11/23/2013   Overview:   Factors complicating this pregnancy   Obesity  HGBA1C  10/23/13   5.4 Plan for twice weekly NST BMI 42.   Cholelithiasis  New onset HTN: BP 140/80, 144/88 on repeat. Yesterday. BP 160/90 in the office. PIH labs normal 3/18. Monitored on L&D for 6 hours. BP 105-126/63-78 making me think that 160/90 was erroneous value. Ms. Easton's arm is large enough such that our cuff is borderl    Patient Active Problem List   Diagnosis Date Noted   Obstructive sleep apnea 05/22/2022   Vitamin D  deficiency 04/09/2022   Class 3 severe obesity with serious comorbidity and body mass index (BMI) of 45.0 to 49.9 in adult [facility]) 04/08/2022   Tonsillar hypertrophy 04/08/2022   Anosmia 04/08/2022   Hypertension 04/08/2022   Current severe episode of major depressive disorder without psychotic features without prior episode (HCC) 04/08/2022   Anxiety and depression 04/08/2022    Past Surgical History:  Procedure Laterality Date   CHOLECYSTECTOMY N/A 11/10/2016   Procedure: LAPAROSCOPIC CHOLECYSTECTOMY;  Surgeon: Wonda Charlie BRAVO, MD;   Location: [facility] ORS;  Service: General;  Laterality: N/A;    OB History   No obstetric history on file.      Home Medications    Prior to Admission medications   Medication Sig Start Date End Date Taking? Authorizing Provider  amoxicillin -clavulanate (AUGMENTIN ) 875-125 MG tablet Take 1 tablet by mouth every 12 (twelve) hours for 7 days. 12/11/23 12/18/23 Yes Arvis Huxley B, PA-C  mupirocin  ointment (BACTROBAN ) 2 % Apply 1 Application topically 2 (two) times daily. 12/11/23  Yes Arvis Huxley NOVAK, PA-C  etonogestrel (NEXPLANON) 68 MG IMPL implant 1 each by Subdermal route once.    [provider]  FLUoxetine  (PROZAC ) 20 MG capsule Take 1 capsule (20 mg total) by mouth daily. 05/11/22   Manya Toribio SQUIBB, PA    Family History Family History  Problem Relation Age of Onset   Hypertension Mother    Diabetes Mother    Heart disease Mother    Heart attack Mother    Other Father        unknown medical history   Cancer Neg Hx     Social History Social History   Tobacco Use   Smoking status: Former    Current packs/day: 0.00    Average packs/day: 1 pack/day for 2.0 years (2.0 ttl pk-yrs)    Types: Cigarettes    Start date: 07/10/2017    Quit date: 07/11/2019    Years  since quitting: 4.4   Smokeless tobacco: Never   Tobacco comments:    1 PACK WILL LAST HER A WEEK  Vaping Use   Vaping status: Never Used  Substance Use Topics   Alcohol use: No   Drug use: No     Allergies   Patient has no known allergies.   Review of Systems Review of Systems  Musculoskeletal:  Negative for arthralgias, gait problem and joint swelling.  Skin:  Positive for wound. Negative for color change.  Neurological:  Negative for weakness and numbness.     Physical Exam Triage Vital Signs ED Triage Vitals  Encounter Vitals Group     BP 12/11/23 1520 131/81     Girls Systolic BP Percentile --      Girls Diastolic BP Percentile --      Boys Systolic BP Percentile --      Boys Diastolic  BP Percentile --      Pulse Rate 12/11/23 1520 71     Resp 12/11/23 1520 16     Temp 12/11/23 1520 98 F (36.7 C)     Temp Source 12/11/23 1520 Oral     SpO2 12/11/23 1520 98 %     Weight 12/11/23 1519 274 lb 14.6 oz (124.7 kg)     Height 12/11/23 1519 5' 4 (1.626 m)     Head Circumference --      Peak Flow --      Pain Score 12/11/23 1518 8     Pain Loc --      Pain Education --      Exclude from Growth Chart --    No data found.  Updated Vital Signs BP 131/81 (BP Location: Right Arm)   Pulse 71   Temp 98 F (36.7 C) (Oral)   Resp 16   Ht 5' 4 (1.626 m)   Wt 274 lb 14.6 oz (124.7 kg)   SpO2 98%   BMI 47.19 kg/m    Physical Exam Vitals and nursing note reviewed.  Constitutional:      General: She is not in acute distress.    Appearance: Normal appearance. She is not ill-appearing or toxic-appearing.  HENT:     Head: Normocephalic and atraumatic.  Eyes:     General: No scleral icterus.       Right eye: No discharge.        Left eye: No discharge.     Conjunctiva/sclera: Conjunctivae normal.  Cardiovascular:     Rate and Rhythm: Normal rate.     Pulses: Normal pulses.  Pulmonary:     Effort: Pulmonary effort is normal. No respiratory distress.  Musculoskeletal:     Cervical back: Neck supple.  Skin:    General: Skin is dry.  Neurological:     General: No focal deficit present.     Mental Status: She is alert. Mental status is at baseline.     Motor: No weakness.     Gait: Gait normal.  Psychiatric:        Mood and Affect: Mood normal.        Behavior: Behavior normal.      UC Treatments / Results  Labs (all labs ordered are listed, but only abnormal results are displayed) Labs Reviewed - No data to display  EKG   Radiology No results found.  Procedures Procedures (including critical care time)  Medications Ordered in UC Medications  Tdap (ADACEL) injection 0.5 mL (0.5 mLs Intramuscular Given 12/11/23 1522)    Initial  Impression /  Assessment and Plan / UC Course  I have reviewed the triage vital signs and the nursing notes.  Pertinent labs & imaging results that were available during my care of the patient were reviewed by me and considered in my medical decision making (see chart for details).   28 year old female presents for wound to the left lower leg that occurred today when she fell through a porch.  See image included in chart.  Patient has an open wound/deep skin avulsion.  Unable to approximate the wound for laceration repair.  Discussed this with patient.  She is understanding.  I sent Augmentin  for prophylaxis.  Also sent mupirocin  ointment and discussed importance of practicing good wound care.  The area was thoroughly cleaned by myself and bacitracin applied as well as nonadherent pad and Coban by nursing staff.  Tetanus immunization was updated today.  Advised patient to return if the wound does not healing in the next couple of weeks or if it is looking infected.   Final Clinical Impressions(s) / UC Diagnoses   Final diagnoses:  Open wound of left lower leg, initial encounter     Discharge Instructions      - I have an open wound of your leg. - Clean the area twice daily with soap and water and cover with a nonstick bandage after applying mupirocin  ointment. - Start antibiotics for prophylaxis.  If you have any evidence of infection which would include increased redness, swelling, pustular drainage seek immediate reevaluation.  If you ever have associated fever, significantly increased swelling or redness, red streaking up the leg go to ER. - If wound is not healing over the next couple weeks return for reevaluation and we may need to refer you to wound care.     ED Prescriptions     Medication Sig Dispense Auth. Provider   amoxicillin -clavulanate (AUGMENTIN ) 875-125 MG tablet Take 1 tablet by mouth every 12 (twelve) hours for 7 days. 14 tablet Arvis Huxley B, PA-C   mupirocin  ointment  (BACTROBAN ) 2 % Apply 1 Application topically 2 (two) times daily. 22 g Arvis Huxley NOVAK, PA-C      PDMP not reviewed this encounter.   Arvis Huxley NOVAK, PA-C 12/11/23 1640

## 2023-12-11 NOTE — ED Triage Notes (Signed)
 Patient states that she was moving furniture through the front porch and fell through the front porch floor about 30 min ago.  Patient states that she has a laceration to her left lower leg.  Patient still has bleeding from the laceration.

## 2023-12-11 NOTE — Discharge Instructions (Signed)
-   I have an open wound of your leg. - Clean the area twice daily with soap and water and cover with a nonstick bandage after applying mupirocin  ointment. - Start antibiotics for prophylaxis.  If you have any evidence of infection which would include increased redness, swelling, pustular drainage seek immediate reevaluation.  If you ever have associated fever, significantly increased swelling or redness, red streaking up the leg go to ER. - If wound is not healing over the next couple weeks return for reevaluation and we may need to refer you to wound care.

## 2023-12-20 ENCOUNTER — Ambulatory Visit
Admission: EM | Admit: 2023-12-20 | Discharge: 2023-12-20 | Disposition: A | Discharge status: Home / Self Care | Attending: Physician Assistant | Admitting: Physician Assistant

## 2023-12-20 DIAGNOSIS — T148XXD Other injury of unspecified body region, subsequent encounter: Secondary | ICD-10-CM

## 2023-12-20 NOTE — ED Triage Notes (Signed)
 Pt presents to UC for f/u on open wound on L leg. Was seen here 2 wks ago for the same issue. Given augmentin  & oinment w/o relief. States leg still painful & draining.

## 2023-12-20 NOTE — ED Provider Notes (Signed)
 [facility]    CSN: [id number] Arrival date & time: 12/20/23  1700      History   Chief Complaint No chief complaint on file.   HPI Alicia Weaver is a 28 y.o. female presenting for re-evaluation of open wound of the left anterior lower leg that occurred 9 days ago.  Patient was evaluated here but wound unable to be closed due to it being an avulsion/open wound instead of laceration. No significant or severe pain.  Able to walk and bear weight. Took antibiotics. Has been cleaning with antibacterial spray and applying Neosporin and non stick bandage. She does not think it is healing. No bleeding, redness or pustular drainage.   HPI  Past Medical History:  Diagnosis Date   Cholelithiasis 10/11/2013   Obesity affecting pregnancy 10/11/2013   Overview:  Pt on 81 mg ASA for PIH risk ( MFM) 15 # weight gain recommended    Supervision of normal pregnancy 11/23/2013   Overview:   Factors complicating this pregnancy   Obesity  HGBA1C  10/23/13   5.4 Plan for twice weekly NST BMI 42.   Cholelithiasis  New onset HTN: BP 140/80, 144/88 on repeat. Yesterday. BP 160/90 in the office. PIH labs normal 3/18. Monitored on L&D for 6 hours. BP 105-126/63-78 making me think that 160/90 was erroneous value. Alicia Weaver's arm is large enough such that our cuff is borderl    Patient Active Problem List   Diagnosis Date Noted   Obstructive sleep apnea 05/22/2022   Vitamin D  deficiency 04/09/2022   Class 3 severe obesity with serious comorbidity and body mass index (BMI) of 45.0 to 49.9 in adult [name] 04/08/2022   Tonsillar hypertrophy 04/08/2022   Anosmia 04/08/2022   Hypertension 04/08/2022   Current severe episode of major depressive disorder without psychotic features without prior episode (HCC) 04/08/2022   Anxiety and depression 04/08/2022    Past Surgical History:  Procedure Laterality Date   CHOLECYSTECTOMY N/A 11/10/2016   Procedure: LAPAROSCOPIC CHOLECYSTECTOMY;  Surgeon:  Wonda Charlie BRAVO, MD;  Location: [facility] ORS;  Service: General;  Laterality: N/A;    OB History   No obstetric history on file.      Home Medications    Prior to Admission medications  Medication Sig Start Date End Date Taking? Authorizing Provider  etonogestrel (NEXPLANON) 68 MG IMPL implant 1 each by Subdermal route once.    [provider]  FLUoxetine  (PROZAC ) 20 MG capsule Take 1 capsule (20 mg total) by mouth daily. 05/11/22   Manya Toribio SQUIBB, PA  mupirocin  ointment (BACTROBAN ) 2 % Apply 1 Application topically 2 (two) times daily. 12/11/23   Arvis Jolan NOVAK, PA-C    Family History Family History  Problem Relation Age of Onset   Hypertension Mother    Diabetes Mother    Heart disease Mother    Heart attack Mother    Other Father        unknown medical history   Cancer Neg Hx     Social History Social History   Tobacco Use   Smoking status: Former    Current packs/day: 0.00    Average packs/day: 1 pack/day for 2.0 years (2.0 ttl pk-yrs)    Types: Cigarettes    Start date: 07/10/2017    Quit date: 07/11/2019    Years since quitting: 4.4   Smokeless tobacco: Never   Tobacco comments:    1 PACK WILL LAST HER A WEEK  Vaping Use   Vaping status: Never  Used  Substance Use Topics   Alcohol use: No   Drug use: No     Allergies   Patient has no known allergies.   Review of Systems Review of Systems  Musculoskeletal:  Negative for arthralgias, gait problem and joint swelling.  Skin:  Positive for wound. Negative for color change.  Neurological:  Negative for weakness and numbness.     Physical Exam Triage Vital Signs  No data found.  Updated Vital Signs BP 113/75 (BP Location: Right Arm)   Pulse 72   Temp 99.3 F (37.4 C) (Oral)   Resp 16   Wt 298 lb 4.8 oz (135.3 kg)   SpO2 95%   BMI 51.20 kg/m    Physical Exam Vitals and nursing note reviewed.  Constitutional:      General: She is not in acute distress.    Appearance: Normal  appearance. She is not ill-appearing or toxic-appearing.  HENT:     Head: Normocephalic and atraumatic.  Eyes:     General: No scleral icterus.       Right eye: No discharge.        Left eye: No discharge.     Conjunctiva/sclera: Conjunctivae normal.  Cardiovascular:     Rate and Rhythm: Normal rate.     Pulses: Normal pulses.  Pulmonary:     Effort: Pulmonary effort is normal. No respiratory distress.  Musculoskeletal:     Cervical back: Neck supple.  Skin:    General: Skin is dry.  Neurological:     General: No focal deficit present.     Mental Status: She is alert. Mental status is at baseline.     Motor: No weakness.     Gait: Gait normal.  Psychiatric:        Mood and Affect: Mood normal.        Behavior: Behavior normal.    12/11/2023   12/20/23  UC Treatments / Results  Labs (all labs ordered are listed, but only abnormal results are displayed) Labs Reviewed - No data to display  EKG   Radiology No results found.  Procedures Procedures (including critical care time)  Medications Ordered in UC Medications - No data to display   Initial Impression / Assessment and Plan / UC Course  I have reviewed the triage vital signs and the nursing notes.  Pertinent labs & imaging results that were available during my care of the patient were reviewed by me and considered in my medical decision making (see chart for details).   28 year old female presents for wound to the left lower leg that occurred 9 days ago when she fell through a porch.  Patient is included in chart.  Image from day of injury on 12/6 and today 12/15.  Patient has an open wound/deep skin avulsion.  No swelling, erythema, pustular drainage.  Delayed wound healing.  Will place referral to wound care.  Discussed importance of practicing good wound care.  Advised patient to return if swelling, pustular drainage or redness.  Final Clinical Impressions(s) / UC Diagnoses   Final diagnoses:  Delayed  wound healing     Discharge Instructions      - Continue cleaning wound and applying Neosporin, covering with a nonstick pad and bandage. - I placed a referral to wound care for you to follow-up. - If pustular drainage, surrounding redness please return here.      ED Prescriptions   None    PDMP not reviewed this encounter.  Arvis Jolan NOVAK, PA-C 12/20/23 1815

## 2023-12-20 NOTE — Discharge Instructions (Addendum)
-   Continue cleaning wound and applying Neosporin, covering with a nonstick pad and bandage. - I placed a referral to wound care for you to follow-up. - If pustular drainage, surrounding redness please return here.

## 2024-02-02 ENCOUNTER — Encounter: Admitting: Physician Assistant
